# Patient Record
Sex: Female | Born: 1965 | Race: Black or African American | Hispanic: No | Marital: Married | Smoking: Never smoker
Health system: Southern US, Community
[De-identification: ages and names within clinical notes are randomized; demographics above are authoritative.]

## PROBLEM LIST (undated history)

## (undated) DIAGNOSIS — E119 Type 2 diabetes mellitus without complications: Secondary | ICD-10-CM

## (undated) DIAGNOSIS — Z87442 Personal history of urinary calculi: Secondary | ICD-10-CM

## (undated) DIAGNOSIS — C801 Malignant (primary) neoplasm, unspecified: Secondary | ICD-10-CM

## (undated) DIAGNOSIS — N950 Postmenopausal bleeding: Secondary | ICD-10-CM

## (undated) DIAGNOSIS — I10 Essential (primary) hypertension: Secondary | ICD-10-CM

## (undated) DIAGNOSIS — Z973 Presence of spectacles and contact lenses: Secondary | ICD-10-CM

## (undated) HISTORY — PX: ENDOMETRIAL ABLATION: SHX621

## (undated) HISTORY — PX: OTHER SURGICAL HISTORY: SHX169

## (undated) HISTORY — PX: LUNG SURGERY: SHX703

---

## 1992-05-21 HISTORY — PX: LIVER SURGERY: SHX698

## 1998-02-25 ENCOUNTER — Other Ambulatory Visit: Admission: RE | Admit: 1998-02-25 | Discharge: 1998-02-25 | Payer: Self-pay | Admitting: Obstetrics and Gynecology

## 1999-08-04 ENCOUNTER — Other Ambulatory Visit: Admission: RE | Admit: 1999-08-04 | Discharge: 1999-08-04 | Payer: Self-pay | Admitting: Obstetrics & Gynecology

## 2001-04-18 ENCOUNTER — Encounter: Payer: Self-pay | Admitting: Obstetrics and Gynecology

## 2001-04-18 ENCOUNTER — Ambulatory Visit (HOSPITAL_COMMUNITY): Admission: RE | Admit: 2001-04-18 | Discharge: 2001-04-18 | Payer: Self-pay | Admitting: Obstetrics and Gynecology

## 2001-05-09 ENCOUNTER — Other Ambulatory Visit: Admission: RE | Admit: 2001-05-09 | Discharge: 2001-05-09 | Payer: Self-pay | Admitting: Obstetrics and Gynecology

## 2002-08-14 ENCOUNTER — Emergency Department (HOSPITAL_COMMUNITY): Admission: EM | Admit: 2002-08-14 | Discharge: 2002-08-14 | Payer: Self-pay | Admitting: Emergency Medicine

## 2002-08-14 ENCOUNTER — Emergency Department (HOSPITAL_COMMUNITY): Admission: EM | Admit: 2002-08-14 | Discharge: 2002-08-15 | Payer: Self-pay | Admitting: Emergency Medicine

## 2002-08-14 ENCOUNTER — Encounter: Payer: Self-pay | Admitting: Emergency Medicine

## 2002-08-18 ENCOUNTER — Inpatient Hospital Stay (HOSPITAL_COMMUNITY): Admission: AD | Admit: 2002-08-18 | Discharge: 2002-08-20 | Payer: Self-pay | Admitting: Family Medicine

## 2004-12-14 ENCOUNTER — Other Ambulatory Visit: Admission: RE | Admit: 2004-12-14 | Discharge: 2004-12-14 | Payer: Self-pay | Admitting: Family Medicine

## 2007-05-22 HISTORY — PX: OTHER SURGICAL HISTORY: SHX169

## 2007-07-11 ENCOUNTER — Emergency Department (HOSPITAL_COMMUNITY): Admission: EM | Admit: 2007-07-11 | Discharge: 2007-07-11 | Payer: Self-pay | Admitting: Emergency Medicine

## 2007-07-14 ENCOUNTER — Ambulatory Visit (HOSPITAL_COMMUNITY): Admission: RE | Admit: 2007-07-14 | Discharge: 2007-07-14 | Payer: Self-pay | Admitting: Urology

## 2007-07-17 ENCOUNTER — Encounter: Admission: RE | Admit: 2007-07-17 | Discharge: 2007-07-17 | Payer: Self-pay | Admitting: Urology

## 2007-08-19 ENCOUNTER — Inpatient Hospital Stay (HOSPITAL_COMMUNITY): Admission: RE | Admit: 2007-08-19 | Discharge: 2007-08-22 | Payer: Self-pay | Admitting: Urology

## 2007-08-19 ENCOUNTER — Encounter (INDEPENDENT_AMBULATORY_CARE_PROVIDER_SITE_OTHER): Payer: Self-pay | Admitting: Urology

## 2008-01-30 ENCOUNTER — Encounter: Admission: RE | Admit: 2008-01-30 | Discharge: 2008-01-30 | Payer: Self-pay | Admitting: Urology

## 2008-03-26 ENCOUNTER — Ambulatory Visit (HOSPITAL_COMMUNITY): Admission: RE | Admit: 2008-03-26 | Discharge: 2008-03-26 | Payer: Self-pay | Admitting: Obstetrics and Gynecology

## 2008-03-26 ENCOUNTER — Encounter (INDEPENDENT_AMBULATORY_CARE_PROVIDER_SITE_OTHER): Payer: Self-pay | Admitting: Obstetrics and Gynecology

## 2008-08-12 ENCOUNTER — Ambulatory Visit: Payer: Self-pay | Admitting: Oncology

## 2008-08-13 ENCOUNTER — Encounter: Admission: RE | Admit: 2008-08-13 | Discharge: 2008-08-13 | Payer: Self-pay | Admitting: Urology

## 2008-08-24 LAB — COMPREHENSIVE METABOLIC PANEL
AST: 11 U/L (ref 0–37)
Albumin: 3.7 g/dL (ref 3.5–5.2)
Alkaline Phosphatase: 43 U/L (ref 39–117)
BUN: 24 mg/dL — ABNORMAL HIGH (ref 6–23)
Potassium: 3.5 mEq/L (ref 3.5–5.3)
Sodium: 137 mEq/L (ref 135–145)
Total Bilirubin: 0.3 mg/dL (ref 0.3–1.2)
Total Protein: 7.1 g/dL (ref 6.0–8.3)

## 2008-08-24 LAB — CBC WITH DIFFERENTIAL/PLATELET
EOS%: 4 % (ref 0.0–7.0)
LYMPH%: 29.5 % (ref 14.0–49.7)
MCH: 28.3 pg (ref 25.1–34.0)
MCV: 85.5 fL (ref 79.5–101.0)
MONO%: 3.6 % (ref 0.0–14.0)
RBC: 3.71 10*6/uL (ref 3.70–5.45)
RDW: 16.3 % — ABNORMAL HIGH (ref 11.2–14.5)

## 2009-01-26 ENCOUNTER — Ambulatory Visit (HOSPITAL_COMMUNITY): Admission: RE | Admit: 2009-01-26 | Discharge: 2009-01-26 | Payer: Self-pay | Admitting: Oncology

## 2009-02-01 ENCOUNTER — Ambulatory Visit: Payer: Self-pay | Admitting: Oncology

## 2009-08-05 ENCOUNTER — Ambulatory Visit: Payer: Self-pay | Admitting: Oncology

## 2009-08-09 LAB — COMPREHENSIVE METABOLIC PANEL WITH GFR
ALT: 12 U/L (ref 0–35)
AST: 14 U/L (ref 0–37)
Albumin: 3.9 g/dL (ref 3.5–5.2)
Alkaline Phosphatase: 47 U/L (ref 39–117)
BUN: 20 mg/dL (ref 6–23)
CO2: 22 meq/L (ref 19–32)
Calcium: 9.3 mg/dL (ref 8.4–10.5)
Chloride: 104 meq/L (ref 96–112)
Creatinine, Ser: 1.12 mg/dL (ref 0.40–1.20)
Glucose, Bld: 88 mg/dL (ref 70–99)
Potassium: 4.3 meq/L (ref 3.5–5.3)
Sodium: 137 meq/L (ref 135–145)
Total Bilirubin: 0.2 mg/dL — ABNORMAL LOW (ref 0.3–1.2)
Total Protein: 7.3 g/dL (ref 6.0–8.3)

## 2009-08-09 LAB — CBC WITH DIFFERENTIAL/PLATELET
BASO%: 0.6 % (ref 0.0–2.0)
Basophils Absolute: 0.1 10e3/uL (ref 0.0–0.1)
EOS%: 4.1 % (ref 0.0–7.0)
Eosinophils Absolute: 0.3 10e3/uL (ref 0.0–0.5)
HCT: 35.5 % (ref 34.8–46.6)
HGB: 12.1 g/dL (ref 11.6–15.9)
LYMPH%: 31.4 % (ref 14.0–49.7)
MCH: 30.3 pg (ref 25.1–34.0)
MCHC: 33.9 g/dL (ref 31.5–36.0)
MCV: 89.4 fL (ref 79.5–101.0)
MONO#: 0.6 10e3/uL (ref 0.1–0.9)
MONO%: 6.6 % (ref 0.0–14.0)
NEUT#: 4.8 10e3/uL (ref 1.5–6.5)
NEUT%: 57.3 % (ref 38.4–76.8)
Platelets: 273 10e3/uL (ref 145–400)
RBC: 3.97 10e6/uL (ref 3.70–5.45)
RDW: 14.1 % (ref 11.2–14.5)
WBC: 8.4 10e3/uL (ref 3.9–10.3)
lymph#: 2.6 10e3/uL (ref 0.9–3.3)

## 2010-02-06 ENCOUNTER — Ambulatory Visit: Payer: Self-pay | Admitting: Oncology

## 2010-02-06 ENCOUNTER — Ambulatory Visit (HOSPITAL_COMMUNITY): Admission: RE | Admit: 2010-02-06 | Discharge: 2010-02-06 | Payer: Self-pay | Admitting: Oncology

## 2010-02-06 LAB — CBC WITH DIFFERENTIAL/PLATELET
Basophils Absolute: 0 10*3/uL (ref 0.0–0.1)
Eosinophils Absolute: 0.4 10*3/uL (ref 0.0–0.5)
HCT: 37.1 % (ref 34.8–46.6)
HGB: 12 g/dL (ref 11.6–15.9)
MCV: 88.1 fL (ref 79.5–101.0)
MONO%: 6.9 % (ref 0.0–14.0)
NEUT#: 4 10*3/uL (ref 1.5–6.5)
NEUT%: 55.5 % (ref 38.4–76.8)
RDW: 14.5 % (ref 11.2–14.5)
lymph#: 2.3 10*3/uL (ref 0.9–3.3)

## 2010-02-06 LAB — COMPREHENSIVE METABOLIC PANEL
Albumin: 4 g/dL (ref 3.5–5.2)
BUN: 13 mg/dL (ref 6–23)
CO2: 26 mEq/L (ref 19–32)
Glucose, Bld: 95 mg/dL (ref 70–99)
Potassium: 4.2 mEq/L (ref 3.5–5.3)
Sodium: 136 mEq/L (ref 135–145)
Total Protein: 7.7 g/dL (ref 6.0–8.3)

## 2010-02-06 LAB — LACTATE DEHYDROGENASE: LDH: 150 U/L (ref 94–250)

## 2010-08-03 LAB — POCT I-STAT, CHEM 8
Creatinine, Ser: 1 mg/dL (ref 0.4–1.2)
Hemoglobin: 13.6 g/dL (ref 12.0–15.0)
Sodium: 137 mEq/L (ref 135–145)
TCO2: 27 mmol/L (ref 0–100)

## 2010-08-18 ENCOUNTER — Other Ambulatory Visit: Payer: Self-pay | Admitting: Oncology

## 2010-08-18 ENCOUNTER — Encounter (HOSPITAL_BASED_OUTPATIENT_CLINIC_OR_DEPARTMENT_OTHER): Payer: BC Managed Care – PPO | Admitting: Oncology

## 2010-08-18 DIAGNOSIS — D649 Anemia, unspecified: Secondary | ICD-10-CM

## 2010-08-18 DIAGNOSIS — C649 Malignant neoplasm of unspecified kidney, except renal pelvis: Secondary | ICD-10-CM

## 2010-08-18 DIAGNOSIS — D509 Iron deficiency anemia, unspecified: Secondary | ICD-10-CM

## 2010-08-18 LAB — COMPREHENSIVE METABOLIC PANEL
ALT: 11 U/L (ref 0–35)
AST: 12 U/L (ref 0–37)
Albumin: 3.9 g/dL (ref 3.5–5.2)
Alkaline Phosphatase: 49 U/L (ref 39–117)
Potassium: 3.9 mEq/L (ref 3.5–5.3)
Sodium: 135 mEq/L (ref 135–145)
Total Protein: 7.7 g/dL (ref 6.0–8.3)

## 2010-08-18 LAB — CBC WITH DIFFERENTIAL/PLATELET
BASO%: 0.3 % (ref 0.0–2.0)
EOS%: 3.5 % (ref 0.0–7.0)
Eosinophils Absolute: 0.3 10*3/uL (ref 0.0–0.5)
MCV: 86.4 fL (ref 79.5–101.0)
MONO%: 6.3 % (ref 0.0–14.0)
NEUT#: 5.9 10*3/uL (ref 1.5–6.5)
RBC: 4.04 10*6/uL (ref 3.70–5.45)
RDW: 14.8 % — ABNORMAL HIGH (ref 11.2–14.5)

## 2010-10-03 NOTE — Op Note (Signed)
NAME:  Jessica Cox, Jessica Cox NO.:  [id number]   MEDICAL RECORD NO.:  [id number]          PATIENT TYPE:  INP   LOCATION:  1405                         FACILITY:  [facility]   PHYSICIAN:  Valetta Fuller, M.D.  DATE OF BIRTH:  07-05-1965   DATE OF PROCEDURE:  08/19/2007  DATE OF DISCHARGE:                               OPERATIVE REPORT   PREOPERATIVE DIAGNOSIS:  Large left renal mass.   POSTOPERATIVE DIAGNOSIS:  Large left renal mass.   PROCEDURE PERFORMED:  Left radical nephrectomy with regional  lymphadenectomy.   SURGEON:  Valetta Fuller, M.D.   ASSISTANT:  Lindaann Slough, M.D.   ANESTHESIA:  General endotracheal.   INDICATIONS:  Jessica Cox is a 45 year old female.  She presented to the  local emergency room with left flank pain and was diagnosed with a 7 mm  stone in her left proximal ureter with some hydronephrosis.  She also  had an unexpected 8-10 cm mass emanating from the upper pole of her left  kidney.  This was felt to be concerning for renal cell carcinoma.  An  MRI was subsequently done which confirmed an enhancing mass of about 10  cm coming off the upper pole of the left kidney radiographically  consistent with a renal cell carcinoma.  No obvious metastatic disease.  No evidence of adenopathy with no evidence of vascular invasion.  The  patient's flank pain resolved and, therefore, we did not feel the stone  had to be dealt with unless it became more symptomatic to her.  The  patient subsequently had a chest x-ray and has had no evidence of  metastatic disease.  This is a large partially hemorrhagic mass with  substantial enhancement and, again, very concerning for renal cell  carcinoma.  We suggested that she undergo a left radical nephrectomy.  The patient accepted that recommendation and appeared to understand the  advantages, disadvantages and potential complications.  I did review her  case with one of our laparoscopic surgeons, he felt this tumor  would be  better dealt with with an open approach which we concurred with.  She  now presents for the procedure.   TECHNIQUE AND FINDINGS:  The patient was identified and the appropriate  site marked.  She was brought to the operating room, placed in a supine  position, and had successful induction of general endotracheal  anesthesia.  The patient was left supine.  A Foley catheter was placed.  The table was slightly flexed.  She was then prepped and draped in a  normal manner.  The patient previously had a right subcostal incision  and we did a left subcostal modified Chevron incision exposing the left  upper quadrant.  Palpably, a very large tumor was noted in the upper  pole of the left kidney.  No gross evidence of metastatic disease.  The  left colon was reflected off of Gerota's fascia.  Inferiorly, the ureter  and gonadal vein was identified.  The gonadal vein was clipped and then  ligated.  The ureter was also clipped and then transected.  The upper  pole  was lifted.  The hilum was identified.  There appeared to be a  single renal vein and artery.  The artery was doubly sutured with silk  suture proximally and then transected.  The renal vein collapse nicely  and was also doubly ligated with #1 silk suture.  Attention was then  turned to the upper pole mass which was carefully freed off some  attachments to the spleen.  The pancreas was identified and no injury  occurred.  The adrenal gland was taken with the specimen utilizing clips  superior to the adrenal gland.  Blood loss was relatively minimal at  less than 100 mL.  There was no active bleeding or other abnormalities.  The whole flank was then copiously irrigated.  There was no pathologic  adenopathy but a few shoddy lymph nodes.  A regional lymphadenectomy  around the area of the hilum was undertaken and that tissue sent  separately for permanent sectioning.  The patient's incision was closed  in an anatomic manner.  A  double layer closure with #1 PDS suture was  performed.  The skin was infiltrated with Marcaine and the skin closed  with clips.  The patient remained hemodynamically stable during the  procedure and had no obvious complications or problems.  She was brought  to the recovery room in stable condition.           ______________________________  Valetta Fuller, M.D.  Electronically Signed     DSG/MEDQ  D:  08/19/2007  T:  08/19/2007  Job:  [id number]

## 2010-10-03 NOTE — Op Note (Signed)
NAME:  Jessica Cox, Jessica Cox NO.:  [id number]   MEDICAL RECORD NO.:  [id number]          PATIENT TYPE:  AMB   LOCATION:  SDC                           FACILITY:  [facility]   PHYSICIAN:  Crist Fat. Rivard, M.D. DATE OF BIRTH:  1965-07-17   DATE OF PROCEDURE:  DATE OF DISCHARGE:                               OPERATIVE REPORT   PREOPERATIVE DIAGNOSES:  Submucosal fibroid with menorrhagia and anemia.   POSTOPERATIVE DIAGNOSES:  Submucosal fibroid with menorrhagia and  anemia.   ANESTHESIA:  General, Dr. Jean Rosenthal.   PROCEDURE:  Hysteroscopy with resection of submucosal fibroid and  endometrial ablation with NovaSure.   SURGEON:  Jalea Bronaugh. Rivard, MD   ASSISTANT:  None.   ESTIMATED BLOOD LOSS:  Minimal.   PROCEDURE IN DETAIL:  After being informed of the planned procedure with  possible complications including bleeding, infection, and uterine  perforation with intra-abdominal organ damage as well as failure of  treatment, informed consent was obtained.  The patient was taken to OR  #7, given general anesthesia with laryngeal mask without complication.  She was placed in the lithotomy position, prepped and draped in a  sterile fashion, and her bladder was emptied with an in-and-out red  rubber catheter.  Pelvic exam reveals an anteverted uterus, normal in  size and shape and mobile, and 2 normal adnexa.  A weighted speculum was  inserted in the vagina.  Anterior lip of the cervix was grasped with a  tenaculum forceps and we proceeded with a paracervical block using  Novocain 1% 20 mL in the usual fashion.  Uterus was then sounded at 10  cm and the cervical length was measured at 3.5 cm.  The cervix was then  easily dilated using Hegar dilator until #33, which allowed easy entry  of an operative hysteroscope with a single-loop resectoscope.  Using  sorbitol at a maximum pressure of 90 mmHg, we were able to visualize the  entire uterine cavity with both tubal ostia, which  appeared normal.  We  easily identified an anterior left cornual submucosal fibroid measuring  3 cm.  We proceeded with its resection with multiple passes until we had  flattened the anterior wall of the uterus.  Hysteroscope was removed.  A  sharp curette was used to remove endometrial curettings.  Hysteroscope  was reinserted.  The cavity was then flushed with LR, and we noted a  satisfactory cavity with good resection of the fibroid and superficial  endometrium having been removed.  Hysteroscope was then removed and the  NovaSure was inserted easily.  The cervix was regrasped with a Jacobs  forceps and the cavity integrity was confirmed.  We then proceeded with  endometrial ablation after firing NovaSure at a power of 104 and a time  of 50 seconds.  The NovaSure was then removed and the hysteroscope was  reinserted to confirm it completely blanched uterine cavity and both  cornua having been blanched appropriately as well.   Instruments were then removed and the Jacobs forceps was removed with no  bleeding.   Instrument and sponge count was complete x2.  Estimated blood loss was  minimal.  Water deficit was 50 mL.  The procedure was very well  tolerated by the patient, who was taken to recovery room in a well and  stable condition and discharged home today.   SPECIMEN:  Submucosal fibroid fragments and endometrial curettings sent  to Pathology.      Crist Fat Rivard, M.D.  Electronically Signed     SAR/MEDQ  D:  03/26/2008  T:  03/27/2008  Job:  [id number]

## 2010-10-03 NOTE — H&P (Signed)
NAME:  Jessica Cox, Jessica Cox NO.:  [id number]   MEDICAL RECORD NO.:  [id number]          PATIENT TYPE:  INP   LOCATION:  1405                         FACILITY:  [facility]   PHYSICIAN:  Valetta Fuller, M.D.  DATE OF BIRTH:  May 31, 1965   DATE OF ADMISSION:  08/19/2007  DATE OF DISCHARGE:                              HISTORY & PHYSICAL   CHIEF COMPLAINT:  Large left renal mass.   HISTORY OF PRESENT ILLNESS:  Jessica Cox is a 45 year old female.  She  recently developed left-sided flank discomfort and presented to the  emergency room where she was diagnosed with a proximal left ureteral  calculus.  She also had a 8-10 cm mass emanating from the upper pole of  her left kidney.  A subsequent MRI demonstrated a partially hemorrhagic  but enhancing mass in the upper pole left kidney quite concerning for  renal cell carcinoma.  There was no evidence of metastatic disease  within the abdomen and chest x-ray was unremarkable.  Her left flank  pain resolved and we did decide that the left ureteral stone did not  require any definitive management.  It was recommended to the patient  that she have a left radical nephrectomy.  She appeared to understand  the advantages and disadvantages of this.  We felt an open approach  would make the most sense.  She is now presenting for admission status  post her surgery which is to be done soon.  She has had no gross  hematuria, really no abdominal or flank discomfort at this point.   PAST MEDICAL HISTORY:  Notable for  1. Diabetes mellitus type 2.  2. Hypertension.  3. Hyperlipidemia.   CURRENT MEDICATIONS:  Include Avandia, iron supplementation and some  over-the-counter and p.r.n. medications.   ALLERGIES:  The patient has no drug allergies.   She is a nonsmoker.   FAMILY HISTORY:  Notable for diabetes mellitus.   REVIEW OF SYSTEMS:  Negative for abdominal or flank discomfort.  No  gross hematuria.   PHYSICAL EXAMINATION:  VITAL  SIGNS:  Blood pressure 134/86.  She is  afebrile.  LUNGS:  Respiratory effort normal with clear lungs to auscultation.  HEART:  Regular rate and rhythm.  ABDOMEN:  Soft and nontender without palpable mass.  No CVA tenderness.  GU:  Normal external genitalia.  EXTREMITIES:  No lower extremity edema.   LABORATORY DATA:  Overall systemic renal function normal.  The patient  does have a chronic anemia with hemoglobin 9.8.   ASSESSMENT:  Large left renal mass 8 x 10 cm.  This is consistent  radiographically with renal cell carcinoma.  The patient is to undergo  radical nephrectomy on the left side today and will hopefully be  admitted for routine postoperative care.           ______________________________  Valetta Fuller, M.D.  Electronically Signed     DSG/MEDQ  D:  08/19/2007  T:  08/20/2007  Job:  [id number]

## 2010-10-06 NOTE — Discharge Summary (Signed)
NAME:  Jessica Cox, Jessica Cox NO.:  [id number]   MEDICAL RECORD NO.:  [id number]          PATIENT TYPE:  INP   LOCATION:  1405                         [facility]ACILITY:  [facility]   PHYSICIAN:  Valetta Fuller, M.D.  DATE OF BIRTH:  1965/08/30   DATE OF ADMISSION:  08/19/2007  DATE OF DISCHARGE:  08/22/2007                               DISCHARGE SUMMARY   DISCHARGE DIAGNOSES:  1. Malignant neoplasm of the kidney.  2. Urinary calculus.  3. Type 2 diabetes mellitus.  4. Hypertension.   PROCEDURE PERFORMED:  Left radical nephrectomy with dissection of hilar  lymph nodes on August 19, 2007.   HOSPITAL COURSE:  Ms. Grays is a 45 year old female.  She had  originally presented to the emergency room with left flank pain and was  diagnosed with a 7 mm stone in her left proximal ureter with  hydronephrosis.  She also had an 8-10 cm mass emanating from the upper  pole of her left kidney which was felt to be radiographically very  concerning for renal cell carcinoma.  The abdominal MRI subsequently  confirmed an enhancing mass on the upper pole left kidney consistent  with renal cell carcinoma.  There was no evidence of any metastatic  disease.  The 7 mm stone was involving the left proximal ureter on the  same side.  Her clinical situation, with regard flank pain, improved  and, therefore, we did not feel that the left ureteral stone required  definitive management since she was going to be undergoing left radical  nephrectomy.  Additional imaging studies showed no evidence of  metastatic disease.  The patient underwent extensive consultation with  regard to surgical issues and elected to proceed with left radical  nephrectomy.   On August 19, 2007, the patient underwent exploration via left chevron  incision.  A large tumor was noted in the upper pole of the left kidney.  There was no evidence of gross metastatic disease.  The surgery was  without incident with minimal blood loss.  The  patient's postoperative  course was also unremarkable.  Her postoperative hemoglobin was 9.5,  compared to 9.8 preoperatively.  Postop creatinine was 1.1.  The patient  had a normal postop ileus which resolved by postoperative day #3.  At  that point she was afebrile.  She had had a bowel movement and was  ambulating.  Her exam was unremarkable.  The patient had no other  obvious problems or difficulties.  Her final pathology needed to be sent  out for confirmation.  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  Four out of four lymph nodes were negative for any  metastatic disease.   DISPOSITION:  The patient was discharged to home.  She was given a  prescription for pain medication.  Routine postoperative instructions  were provided and the patient will follow up in approximately 7-10 days  in our office.           ______________________________  Valetta Fuller, M.D.  Electronically  Signed    DSG/MEDQ  D:  10/20/2007  T:  10/20/2007  Job:  [id number]

## 2010-10-10 NOTE — Discharge Summary (Signed)
NAME:  Jessica, Cox                        ACCOUNT NO.:  [id number]   MEDICAL RECORD NO.:  [id number]                   PATIENT TYPE:   LOCATION:                                       FACILITY:   PHYSICIAN:  Deirdre Peer. Polite, M.D.              DATE OF BIRTH:   DATE OF ADMISSION:  DATE OF DISCHARGE:                                 DISCHARGE SUMMARY   DISCHARGE DIAGNOSES:  1. Left periorbital cellulitis.  2. Chronic sinusitis.  3. Microcytic anemia.   DISCHARGE MEDICATIONS:  1. Unasyn 3 g IV q.6h. x2 days.  2. Ferrous sulfate 325 mg one q.8h.   DISPOSITION:  The patient is being discharged in stable condition.   FOLLOWUP:  1. Outpatient followup with ENT in one day with Dr. Jearld Fenton.  2. Outpatient followup with primary M.D. in one week, Dr. Rodman Key on     07/27/2002 at 9:30.   CONSULTATIONS:  Dr. Jearld Fenton of ENT.   STUDIES:  CT scan of the head __________ appearance of the brain,  inflammatory paranasal sinus disease worse on the left.  Blood cultures  negative.  BMET within normal limits.  CBC with a hemoglobin of 8.7, MCV  71.4.  Outpatient CT scan at [facility] showed periorbital soft  tissue swelling due to cellulitis, most likely secondary to extensive  paranasal sinus disease   HISTORY OF PRESENT ILLNESS:  The patient is a 45 year old black female who  was admitted to [facility] after failing treatment for chronic  sinusitis.  In addition to failing outpatient treatment for sinusitis the  patient developed significant periorbital cellulitis on the left eye.  Admission was deemed necessary for further evaluation and treatment.   PAST MEDICAL HISTORY:  1. As stated above.  2. In addition, includes seasonal allergies.  3. Microcytic anemia.  4. Hepatic hemangiomas.   OUTPATIENT MEDICATIONS:  1. Beconase.  2. Multivitamin.   FAMILY HISTORY:  Noncontributory.   REVIEW OF SYMPTOMS:  Negative for fever, chills, or headaches.  Significant  for left eye swelling, pain, no discharge.   PHYSICAL EXAMINATION:  VITAL SIGNS:  Hemodynamically stable, afebrile.  HEENT:  Significant for left eye periorbital swelling, erythema, no  discharge.  Extraocular movements were intact.  Sclerae not injected.  The  rest of the physical examination is within normal limits.   HOSPITAL COURSE:  The patient was admitted to a medicine floor bed for  evaluation and treatment of left periorbital cellulitis secondary to chronic  sinusitis.  The patient was treated with IV antibiotics, and was seen in  consultation by ENT, Dr. Jearld Fenton.  The patient's medical management was  further optimized by adding Afrin and saline spray.  Plan was for possible  surgery if no improvement with above treatment.  However, the patient's  hospital course was one of continued improvement, no surgical procedure was  indicated.  On 08/20/2002, the patient was deemed medically stable for  discharge by ENT.  Plans are for outpatient antibiotics x2 days, then  continue p.o. antibiotics which will be instituted by Dr. Jearld Fenton.  At this  time, the patient is medically stable for discharge.                                                  Deirdre Peer. Polite, M.D.    RDP/MEDQ  D:  08/20/2002  T:  08/21/2002  Job:  [id number]

## 2011-02-12 LAB — URINALYSIS, ROUTINE W REFLEX MICROSCOPIC
Bilirubin Urine: NEGATIVE
Glucose, UA: NEGATIVE
Glucose, UA: NEGATIVE
Ketones, ur: NEGATIVE
Leukocytes, UA: NEGATIVE
Protein, ur: NEGATIVE
Protein, ur: NEGATIVE
pH: 6
pH: 7.5

## 2011-02-12 LAB — CROSSMATCH
ABO/RH(D): O POS
Antibody Screen: NEGATIVE

## 2011-02-12 LAB — URINE MICROSCOPIC-ADD ON

## 2011-02-12 LAB — CBC
HCT: 29 — ABNORMAL LOW
MCHC: 32.6
MCV: 76.9 — ABNORMAL LOW
Platelets: 417 — ABNORMAL HIGH
RDW: 16.7 — ABNORMAL HIGH
WBC: 11.9 — ABNORMAL HIGH

## 2011-02-12 LAB — HEMOGLOBIN AND HEMATOCRIT, BLOOD: Hemoglobin: 9.8 — ABNORMAL LOW

## 2011-02-12 LAB — COMPREHENSIVE METABOLIC PANEL
AST: 18
Albumin: 3.3 — ABNORMAL LOW
BUN: 11
Calcium: 8.7
Creatinine, Ser: 0.92
GFR calc Af Amer: >60
Total Protein: 7.5

## 2011-02-12 LAB — DIFFERENTIAL
Basophils Absolute: 0.1
Eosinophils Relative: 2
Lymphocytes Relative: 19
Lymphs Abs: 2.3
Monocytes Absolute: 0.5
Neutro Abs: 8.9 — ABNORMAL HIGH

## 2011-02-12 LAB — POCT CARDIAC MARKERS
CKMB, poc: 1
Myoglobin, poc: 52.5
Troponin i, poc: <0.05

## 2011-02-12 LAB — BASIC METABOLIC PANEL
BUN: 7
Calcium: 9.1
Creatinine, Ser: 0.76
GFR calc non Af Amer: >60
Glucose, Bld: 133 — ABNORMAL HIGH

## 2011-02-13 LAB — BASIC METABOLIC PANEL
CO2: 29
GFR calc Af Amer: >60
GFR calc non Af Amer: 56 — ABNORMAL LOW
Glucose, Bld: 112 — ABNORMAL HIGH
Potassium: 4
Sodium: 138

## 2011-02-13 LAB — CBC
HCT: 27.8 — ABNORMAL LOW
Hemoglobin: 9.5 — ABNORMAL LOW
RBC: 3.61 — ABNORMAL LOW
RDW: 19.2 — ABNORMAL HIGH

## 2011-02-13 LAB — DIFFERENTIAL
Basophils Absolute: 0
Eosinophils Relative: 1
Lymphocytes Relative: 16
Monocytes Absolute: 0.6
Monocytes Relative: 5
Neutro Abs: 9.2 — ABNORMAL HIGH

## 2011-02-14 ENCOUNTER — Other Ambulatory Visit: Payer: Self-pay | Admitting: Oncology

## 2011-02-14 ENCOUNTER — Ambulatory Visit (HOSPITAL_COMMUNITY)
Admission: RE | Admit: 2011-02-14 | Discharge: 2011-02-14 | Disposition: A | Discharge status: Home / Self Care | Payer: BC Managed Care – PPO | Source: Ambulatory Visit | Attending: Oncology | Admitting: Oncology

## 2011-02-14 ENCOUNTER — Other Ambulatory Visit: Payer: Self-pay | Admitting: Obstetrics and Gynecology

## 2011-02-14 ENCOUNTER — Encounter (HOSPITAL_BASED_OUTPATIENT_CLINIC_OR_DEPARTMENT_OTHER): Payer: BC Managed Care – PPO | Admitting: Oncology

## 2011-02-14 DIAGNOSIS — C649 Malignant neoplasm of unspecified kidney, except renal pelvis: Secondary | ICD-10-CM

## 2011-02-14 DIAGNOSIS — Z85528 Personal history of other malignant neoplasm of kidney: Secondary | ICD-10-CM | POA: Insufficient documentation

## 2011-02-14 DIAGNOSIS — Z1231 Encounter for screening mammogram for malignant neoplasm of breast: Secondary | ICD-10-CM

## 2011-02-14 DIAGNOSIS — R911 Solitary pulmonary nodule: Secondary | ICD-10-CM | POA: Insufficient documentation

## 2011-02-14 DIAGNOSIS — Z09 Encounter for follow-up examination after completed treatment for conditions other than malignant neoplasm: Secondary | ICD-10-CM | POA: Insufficient documentation

## 2011-02-14 DIAGNOSIS — K5730 Diverticulosis of large intestine without perforation or abscess without bleeding: Secondary | ICD-10-CM | POA: Insufficient documentation

## 2011-02-14 LAB — CMP (CANCER CENTER ONLY)
ALT(SGPT): 20 U/L (ref 10–47)
AST: 19 U/L (ref 11–38)
BUN, Bld: 13 mg/dL (ref 7–22)
Calcium: 9 mg/dL (ref 8.0–10.3)
Chloride: 99 mEq/L (ref 98–108)
Creat: 1 mg/dl (ref 0.6–1.2)
Total Bilirubin: 0.3 mg/dl (ref 0.20–1.60)

## 2011-02-14 LAB — CBC WITH DIFFERENTIAL/PLATELET
BASO%: 0.7 % (ref 0.0–2.0)
Basophils Absolute: 0 10*3/uL (ref 0.0–0.1)
EOS%: 5.3 % (ref 0.0–7.0)
HCT: 33.3 % — ABNORMAL LOW (ref 34.8–46.6)
HGB: 11.3 g/dL — ABNORMAL LOW (ref 11.6–15.9)
LYMPH%: 26.9 % (ref 14.0–49.7)
MCH: 29.2 pg (ref 25.1–34.0)
MCHC: 33.8 g/dL (ref 31.5–36.0)
MCV: 86.6 fL (ref 79.5–101.0)
MONO%: 7.6 % (ref 0.0–14.0)
NEUT%: 59.5 % (ref 38.4–76.8)
Platelets: 269 10*3/uL (ref 145–400)
lymph#: 2 10*3/uL (ref 0.9–3.3)

## 2011-02-20 LAB — CBC
HCT: 32.2 — ABNORMAL LOW
Hemoglobin: 10.6 — ABNORMAL LOW
MCV: 85.8
RDW: 15.3
WBC: 8.6

## 2011-02-20 LAB — BASIC METABOLIC PANEL
BUN: 17
Chloride: 101
GFR calc non Af Amer: 57 — ABNORMAL LOW
Glucose, Bld: 115 — ABNORMAL HIGH
Potassium: 3.5
Sodium: 135

## 2011-02-23 ENCOUNTER — Encounter (HOSPITAL_BASED_OUTPATIENT_CLINIC_OR_DEPARTMENT_OTHER): Payer: BC Managed Care – PPO | Admitting: Oncology

## 2011-02-23 ENCOUNTER — Ambulatory Visit
Admission: RE | Admit: 2011-02-23 | Discharge: 2011-02-23 | Disposition: A | Discharge status: Home / Self Care | Payer: BC Managed Care – PPO | Source: Ambulatory Visit | Attending: Obstetrics and Gynecology | Admitting: Obstetrics and Gynecology

## 2011-02-23 DIAGNOSIS — Z1231 Encounter for screening mammogram for malignant neoplasm of breast: Secondary | ICD-10-CM

## 2011-02-23 DIAGNOSIS — D509 Iron deficiency anemia, unspecified: Secondary | ICD-10-CM

## 2011-02-23 DIAGNOSIS — C649 Malignant neoplasm of unspecified kidney, except renal pelvis: Secondary | ICD-10-CM

## 2011-06-28 ENCOUNTER — Telehealth: Payer: Self-pay | Admitting: Oncology

## 2011-06-28 NOTE — Telephone Encounter (Signed)
Called pt, left message, 08/22/11, lab and MD

## 2011-07-06 ENCOUNTER — Telehealth: Payer: Self-pay | Admitting: Oncology

## 2011-07-06 NOTE — Telephone Encounter (Signed)
PT CALLED AND R/S APPT FOR 04/03 TO 04/05

## 2011-08-22 ENCOUNTER — Other Ambulatory Visit: Payer: BC Managed Care – PPO | Admitting: Lab

## 2011-08-22 ENCOUNTER — Ambulatory Visit: Payer: BC Managed Care – PPO | Admitting: Oncology

## 2011-08-24 ENCOUNTER — Other Ambulatory Visit (HOSPITAL_BASED_OUTPATIENT_CLINIC_OR_DEPARTMENT_OTHER): Payer: BC Managed Care – PPO | Admitting: Lab

## 2011-08-24 ENCOUNTER — Ambulatory Visit (HOSPITAL_BASED_OUTPATIENT_CLINIC_OR_DEPARTMENT_OTHER): Payer: BC Managed Care – PPO | Admitting: Oncology

## 2011-08-24 ENCOUNTER — Telehealth: Payer: Self-pay | Admitting: Oncology

## 2011-08-24 VITALS — BP 123/77 | HR 104 | Temp 97.9°F | Ht 65.0 in | Wt 241.4 lb

## 2011-08-24 DIAGNOSIS — D649 Anemia, unspecified: Secondary | ICD-10-CM

## 2011-08-24 DIAGNOSIS — C649 Malignant neoplasm of unspecified kidney, except renal pelvis: Secondary | ICD-10-CM

## 2011-08-24 DIAGNOSIS — D509 Iron deficiency anemia, unspecified: Secondary | ICD-10-CM

## 2011-08-24 LAB — COMPREHENSIVE METABOLIC PANEL
ALT: 11 U/L (ref 0–35)
AST: 11 U/L (ref 0–37)
Albumin: 3.7 g/dL (ref 3.5–5.2)
Alkaline Phosphatase: 54 U/L (ref 39–117)
Glucose, Bld: 144 mg/dL — ABNORMAL HIGH (ref 70–99)
Potassium: 3.9 mEq/L (ref 3.5–5.3)
Sodium: 136 mEq/L (ref 135–145)
Total Protein: 7.4 g/dL (ref 6.0–8.3)

## 2011-08-24 LAB — CBC WITH DIFFERENTIAL/PLATELET
Eosinophils Absolute: 0.3 10*3/uL (ref 0.0–0.5)
HCT: 33.3 % — ABNORMAL LOW (ref 34.8–46.6)
LYMPH%: 33.8 % (ref 14.0–49.7)
MCV: 86.8 fL (ref 79.5–101.0)
MONO#: 0.5 10*3/uL (ref 0.1–0.9)
MONO%: 5.8 % (ref 0.0–14.0)
NEUT#: 4.3 10*3/uL (ref 1.5–6.5)
NEUT%: 55.6 % (ref 38.4–76.8)
Platelets: 291 10*3/uL (ref 145–400)
WBC: 7.7 10*3/uL (ref 3.9–10.3)

## 2011-08-24 NOTE — Telephone Encounter (Signed)
Gv pt appt for oct2013 °

## 2011-08-24 NOTE — Progress Notes (Signed)
Hematology and Oncology Follow Up Visit  Jessica Cox [id number] 09-12-1965 46 y.o. 08/24/2011 3:08 PM  CC: Jessica Cox, M.D.  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   Current therapy: Observation and surveillance.  Interim History:  Jessica Cox presents today for a followup visit, a pleasant 46 year old female with MTSCC surgically removed in 2009.  She has continued to do very well without any evidence to suggest recurrent disease.  She did not report any abdominal pain or flank pain.  She did not report any hematuria or dysuria.  She had not had any major changes in performance status or activity level.  She has continued to perform activities without any major decline.  She had not had any more than usual menstrual bleeding.  She had reported stopping taking iron replacement.  Medications: I have reviewed the patient's current medications. No current outpatient prescriptions on file.  Allergies: Allergies not on file  Past Medical History, Surgical history, Social history, and Family History were reviewed and updated.  Review of Systems: Constitutional:  Negative for fever, chills, night sweats, anorexia, weight loss, pain. Cardiovascular: no chest pain or dyspnea on exertion Respiratory: negative Neurological: negative Dermatological: negative ENT: negative Skin: Negative. Gastrointestinal: no abdominal pain, change in bowel habits, or black or bloody stools Genito-Urinary: negative Hematological and Lymphatic: negative Breast: negative Musculoskeletal: negative Remaining ROS negative. Physical Exam: Blood pressure 123/77, pulse 104, temperature 97.9 F (36.6 C), temperature source Oral, height 5\' 5"  (1.651 m), weight 241 lb 6.4 oz (109.498 kg). ECOG:  General appearance: alert Head: Normocephalic,  without obvious abnormality, atraumatic Neck: no adenopathy, no carotid bruit, no JVD, supple, symmetrical, trachea midline and thyroid not enlarged, symmetric, no tenderness/mass/nodules Lymph nodes: Cervical, supraclavicular, and axillary nodes normal. Heart:regular rate and rhythm, S1, S2 normal, no murmur, click, rub or gallop Lung:chest clear, no wheezing, rales, normal symmetric air entry Abdomin: soft, non-tender, without masses or organomegaly EXT:no erythema, induration, or nodules   Lab Results: Lab Results  Component Value Date   WBC 7.7 08/24/2011   HGB 10.8* 08/24/2011   HCT 33.3* 08/24/2011   MCV 86.8 08/24/2011   PLT 291 08/24/2011     Chemistry      Component Value Date/Time   NA 141 02/14/2011 0819   NA 135 08/18/2010 0844   NA 135 08/18/2010 0844   K 4.2 02/14/2011 0819   K 3.9 08/18/2010 0844   K 3.9 08/18/2010 0844   CL 99 02/14/2011 0819   CL 100 08/18/2010 0844   CL 100 08/18/2010 0844   CO2 26 02/14/2011 0819   CO2 23 08/18/2010 0844   CO2 23 08/18/2010 0844   BUN 13 02/14/2011 0819   BUN 17 08/18/2010 0844   BUN 17 08/18/2010 0844   CREATININE 1.0 02/14/2011 0819   CREATININE 1.19 08/18/2010 0844   CREATININE 1.19 08/18/2010 0844      Component Value Date/Time   CALCIUM 9.0 02/14/2011 0819   CALCIUM 9.0 08/18/2010 0844   CALCIUM 9.0 08/18/2010 0844   ALKPHOS 52 02/14/2011 0819   ALKPHOS 49 08/18/2010 0844   ALKPHOS 49 08/18/2010 0844   AST 19 02/14/2011 0819   AST 12 08/18/2010 0844   AST 12 08/18/2010 0844   ALT 11 08/18/2010 0844   ALT 11 08/18/2010 0844   BILITOT 0.30 02/14/2011 0819   BILITOT  0.3 08/18/2010 0844   BILITOT 0.3 08/18/2010 0844      Impression and Plan:  This is a 46 year old female with the following issues: 1. 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  Continue followup every 6 months and imaging studies as needed 2. Iron deficiency anemia.  I recommended her to restart  her iron replacement at this time.  Followup will be in 6 months' time. I have talked to her about IV iron if she could not tolerate po iron.   [facility], MD 4/5/20133:08 PM

## 2012-02-22 ENCOUNTER — Other Ambulatory Visit (HOSPITAL_BASED_OUTPATIENT_CLINIC_OR_DEPARTMENT_OTHER): Payer: BC Managed Care – PPO | Admitting: Lab

## 2012-02-22 ENCOUNTER — Ambulatory Visit (HOSPITAL_BASED_OUTPATIENT_CLINIC_OR_DEPARTMENT_OTHER): Payer: BC Managed Care – PPO | Admitting: Oncology

## 2012-02-22 ENCOUNTER — Telehealth: Payer: Self-pay | Admitting: Oncology

## 2012-02-22 VITALS — BP 118/77 | HR 114 | Temp 99.1°F | Resp 22 | Ht 65.0 in | Wt 251.7 lb

## 2012-02-22 DIAGNOSIS — C649 Malignant neoplasm of unspecified kidney, except renal pelvis: Secondary | ICD-10-CM

## 2012-02-22 DIAGNOSIS — D649 Anemia, unspecified: Secondary | ICD-10-CM

## 2012-02-22 DIAGNOSIS — D509 Iron deficiency anemia, unspecified: Secondary | ICD-10-CM

## 2012-02-22 LAB — CBC WITH DIFFERENTIAL/PLATELET
EOS%: 3.8 % (ref 0.0–7.0)
LYMPH%: 24.1 % (ref 14.0–49.7)
MCH: 27.5 pg (ref 25.1–34.0)
MCHC: 31.6 g/dL (ref 31.5–36.0)
MCV: 87 fL (ref 79.5–101.0)
MONO%: 7.2 % (ref 0.0–14.0)
NEUT#: 7.5 10*3/uL — ABNORMAL HIGH (ref 1.5–6.5)
Platelets: 288 10*3/uL (ref 145–400)
RBC: 3.96 10*6/uL (ref 3.70–5.45)
RDW: 15.4 % — ABNORMAL HIGH (ref 11.2–14.5)

## 2012-02-22 LAB — COMPREHENSIVE METABOLIC PANEL (CC13)
ALT: 14 U/L (ref 0–55)
CO2: 20 mEq/L — ABNORMAL LOW (ref 22–29)
Calcium: 9.4 mg/dL (ref 8.4–10.4)
Chloride: 103 mEq/L (ref 98–107)
Creatinine: 1.2 mg/dL — ABNORMAL HIGH (ref 0.6–1.1)
Glucose: 171 mg/dl — ABNORMAL HIGH (ref 70–99)
Total Bilirubin: 0.2 mg/dL (ref 0.20–1.20)
Total Protein: 8 g/dL (ref 6.4–8.3)

## 2012-02-22 LAB — IRON AND TIBC: Iron: 33 ug/dL — ABNORMAL LOW (ref 42–145)

## 2012-02-22 LAB — FERRITIN: Ferritin: 85 ng/mL (ref 10–291)

## 2012-02-22 NOTE — Progress Notes (Signed)
Hematology and Oncology Follow Up Visit  Jessica Cox [id number] 25-Mar-1966 46 y.o. 02/22/2012 3:41 PM  CC: Jessica Cox, M.D.  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  Current therapy: Observation and surveillance.  Interim History: Jessica Cox presents today for a followup visit, a pleasant 46 year old female with MTSCC surgically removed in 2009. She has continued to do very well without any evidence to suggest recurrent disease.  She did not report any abdominal pain or flank pain.  She did not report any hematuria or dysuria.  She had not had any major changes in performance status or activity level.  She has continued to perform activities without any major decline.  She had not had any more than usual menstrual bleeding.  She had reported stopping taking iron replacement as it causing her to have diarrhea.   Medications: I have reviewed the patient's current medications. Current outpatient prescriptions:fluticasone (FLONASE) 50 MCG/ACT nasal spray, Place 2 sprays into the nose daily., Disp: , Rfl: ;  pioglitazone (ACTOS) 15 MG tablet, Take 15 mg by mouth daily., Disp: , Rfl: ;  simvastatin (ZOCOR) 10 MG tablet, Take 10 mg by mouth at bedtime., Disp: , Rfl: ;  triamterene-hydrochlorothiazide (MAXZIDE-25) 37.5-25 MG per tablet, Take 1 tablet by mouth daily., Disp: , Rfl:   Allergies: No Known Allergies  Past Medical History, Surgical history, Social history, and Family History were reviewed and updated.  Review of Systems: Constitutional:  Negative for fever, chills, night sweats, anorexia, weight loss, pain. Cardiovascular: no chest pain or dyspnea on exertion Respiratory: negative Neurological: negative Dermatological: negative ENT: negative Skin: Negative. Gastrointestinal: no abdominal pain, change in bowel  habits, or black or bloody stools Genito-Urinary: negative Hematological and Lymphatic: negative Breast: negative Musculoskeletal: negative Remaining ROS negative. Physical Exam: Blood pressure 118/77, pulse 114, temperature 99.1 F (37.3 C), temperature source Oral, resp. rate 22, height 5\' 5"  (1.651 m), weight 251 lb 11.2 oz (114.17 kg). ECOG:  General appearance: alert Head: Normocephalic, without obvious abnormality, atraumatic Neck: no adenopathy, no carotid bruit, no JVD, supple, symmetrical, trachea midline and thyroid not enlarged, symmetric, no tenderness/mass/nodules Lymph nodes: Cervical, supraclavicular, and axillary nodes normal. Heart:regular rate and rhythm, S1, S2 normal, no murmur, click, rub or gallop Lung:chest clear, no wheezing, rales, normal symmetric air entry Abdomin: soft, non-tender, without masses or organomegaly EXT:no erythema, induration, or nodules   Lab Results: Lab Results  Component Value Date   WBC 11.7* 02/22/2012   HGB 10.9* 02/22/2012   HCT 34.4* 02/22/2012   MCV 87.0 02/22/2012   PLT 288 02/22/2012     Chemistry      Component Value Date/Time   NA 136 08/24/2011 1420   NA 141 02/14/2011 0819   K 3.9 08/24/2011 1420   K 4.2 02/14/2011 0819   CL 101 08/24/2011 1420   CL 99 02/14/2011 0819   CO2 21 08/24/2011 1420   CO2 26 02/14/2011 0819   BUN 16 08/24/2011 1420   BUN 13 02/14/2011 0819   CREATININE 1.08 08/24/2011 1420   CREATININE 1.0 02/14/2011 0819      Component Value Date/Time   CALCIUM 9.0 08/24/2011 1420   CALCIUM 9.0 02/14/2011 0819   ALKPHOS 54 08/24/2011 1420   ALKPHOS 52 02/14/2011 0819   AST 11 08/24/2011 1420   AST 19 02/14/2011 0819   ALT 11 08/24/2011  1420   BILITOT 0.2* 08/24/2011 1420   BILITOT 0.30 02/14/2011 0819      Impression and Plan:  This is a 46 year old female with the following issues: 1. 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  Continue  followup every 6 months and imaging studies as needed 2. Iron deficiency anemia.  I have talked to her about IV iron since she could not tolerate po iron. She elected to defer that for now and will consider that if her counts drop further.   Jessica Hose, MD 10/4/20133:41 PM

## 2012-02-22 NOTE — Telephone Encounter (Signed)
appts made and printed for pt aom °

## 2012-07-05 ENCOUNTER — Other Ambulatory Visit: Payer: Self-pay

## 2012-08-21 ENCOUNTER — Telehealth: Payer: Self-pay | Admitting: Oncology

## 2012-08-22 ENCOUNTER — Other Ambulatory Visit: Payer: BC Managed Care – PPO | Admitting: Lab

## 2012-08-22 ENCOUNTER — Ambulatory Visit: Payer: BC Managed Care – PPO | Admitting: Oncology

## 2012-12-16 ENCOUNTER — Other Ambulatory Visit: Payer: Self-pay

## 2012-12-16 DIAGNOSIS — Z1231 Encounter for screening mammogram for malignant neoplasm of breast: Secondary | ICD-10-CM

## 2013-01-02 ENCOUNTER — Ambulatory Visit
Admission: RE | Admit: 2013-01-02 | Discharge: 2013-01-02 | Disposition: A | Discharge status: Home / Self Care | Payer: No Typology Code available for payment source | Source: Ambulatory Visit

## 2013-01-02 DIAGNOSIS — Z1231 Encounter for screening mammogram for malignant neoplasm of breast: Secondary | ICD-10-CM

## 2013-03-26 ENCOUNTER — Other Ambulatory Visit: Payer: Self-pay

## 2013-11-27 ENCOUNTER — Other Ambulatory Visit: Payer: Self-pay | Admitting: Family Medicine

## 2013-11-27 ENCOUNTER — Other Ambulatory Visit (HOSPITAL_COMMUNITY)
Admission: RE | Admit: 2013-11-27 | Discharge: 2013-11-27 | Disposition: A | Discharge status: Home / Self Care | Payer: No Typology Code available for payment source | Source: Ambulatory Visit | Attending: Family Medicine | Admitting: Family Medicine

## 2013-11-27 DIAGNOSIS — Z01419 Encounter for gynecological examination (general) (routine) without abnormal findings: Secondary | ICD-10-CM | POA: Insufficient documentation

## 2013-12-01 LAB — CYTOLOGY - PAP

## 2015-08-26 ENCOUNTER — Emergency Department (HOSPITAL_COMMUNITY): Payer: 59

## 2015-08-26 ENCOUNTER — Emergency Department (HOSPITAL_COMMUNITY)
Admission: EM | Admit: 2015-08-26 | Discharge: 2015-08-26 | Disposition: A | Discharge status: Home / Self Care | Payer: 59 | Attending: Emergency Medicine | Admitting: Emergency Medicine

## 2015-08-26 ENCOUNTER — Encounter (HOSPITAL_COMMUNITY): Payer: Self-pay | Admitting: Emergency Medicine

## 2015-08-26 DIAGNOSIS — Z79899 Other long term (current) drug therapy: Secondary | ICD-10-CM | POA: Diagnosis not present

## 2015-08-26 DIAGNOSIS — R110 Nausea: Secondary | ICD-10-CM | POA: Diagnosis not present

## 2015-08-26 DIAGNOSIS — R197 Diarrhea, unspecified: Secondary | ICD-10-CM | POA: Diagnosis not present

## 2015-08-26 DIAGNOSIS — Z85528 Personal history of other malignant neoplasm of kidney: Secondary | ICD-10-CM | POA: Diagnosis not present

## 2015-08-26 DIAGNOSIS — E119 Type 2 diabetes mellitus without complications: Secondary | ICD-10-CM | POA: Insufficient documentation

## 2015-08-26 DIAGNOSIS — Z7984 Long term (current) use of oral hypoglycemic drugs: Secondary | ICD-10-CM | POA: Diagnosis not present

## 2015-08-26 DIAGNOSIS — I10 Essential (primary) hypertension: Secondary | ICD-10-CM | POA: Insufficient documentation

## 2015-08-26 DIAGNOSIS — Z3202 Encounter for pregnancy test, result negative: Secondary | ICD-10-CM | POA: Diagnosis not present

## 2015-08-26 DIAGNOSIS — R109 Unspecified abdominal pain: Secondary | ICD-10-CM

## 2015-08-26 DIAGNOSIS — R1032 Left lower quadrant pain: Secondary | ICD-10-CM | POA: Diagnosis not present

## 2015-08-26 DIAGNOSIS — Z87442 Personal history of urinary calculi: Secondary | ICD-10-CM

## 2015-08-26 HISTORY — DX: Malignant (primary) neoplasm, unspecified: C80.1

## 2015-08-26 HISTORY — DX: Essential (primary) hypertension: I10

## 2015-08-26 HISTORY — DX: Type 2 diabetes mellitus without complications: E11.9

## 2015-08-26 LAB — URINALYSIS, ROUTINE W REFLEX MICROSCOPIC
Bilirubin Urine: NEGATIVE
GLUCOSE, UA: NEGATIVE mg/dL
Hgb urine dipstick: NEGATIVE
Ketones, ur: NEGATIVE mg/dL
LEUKOCYTES UA: NEGATIVE
Nitrite: NEGATIVE
PH: 6.5 (ref 5.0–8.0)
Protein, ur: NEGATIVE mg/dL
Specific Gravity, Urine: 1.011 (ref 1.005–1.030)

## 2015-08-26 LAB — COMPREHENSIVE METABOLIC PANEL
ALBUMIN: 2.7 g/dL — AB (ref 3.5–5.0)
ALK PHOS: 77 U/L (ref 38–126)
ALT: 13 U/L — AB (ref 14–54)
ANION GAP: 12 (ref 5–15)
AST: 13 U/L — ABNORMAL LOW (ref 15–41)
BILIRUBIN TOTAL: 0.3 mg/dL (ref 0.3–1.2)
BUN: 18 mg/dL (ref 6–20)
CALCIUM: 8.6 mg/dL — AB (ref 8.9–10.3)
CO2: 27 mmol/L (ref 22–32)
CREATININE: 1.49 mg/dL — AB (ref 0.44–1.00)
Chloride: 98 mmol/L — ABNORMAL LOW (ref 101–111)
GFR calc Af Amer: 47 mL/min — ABNORMAL LOW (ref >60)
GFR calc non Af Amer: 41 mL/min — ABNORMAL LOW (ref >60)
GLUCOSE: 122 mg/dL — AB (ref 65–99)
Potassium: 2.8 mmol/L — ABNORMAL LOW (ref 3.5–5.1)
SODIUM: 137 mmol/L (ref 135–145)
TOTAL PROTEIN: 8.5 g/dL — AB (ref 6.5–8.1)

## 2015-08-26 LAB — CBC
HCT: 27.2 % — ABNORMAL LOW (ref 36.0–46.0)
Hemoglobin: 8.6 g/dL — ABNORMAL LOW (ref 12.0–15.0)
MCH: 24.6 pg — AB (ref 26.0–34.0)
MCHC: 31.6 g/dL (ref 30.0–36.0)
MCV: 77.9 fL — ABNORMAL LOW (ref 78.0–100.0)
Platelets: 508 10*3/uL — ABNORMAL HIGH (ref 150–400)
RBC: 3.49 MIL/uL — ABNORMAL LOW (ref 3.87–5.11)
RDW: 15.9 % — AB (ref 11.5–15.5)
WBC: 15 10*3/uL — ABNORMAL HIGH (ref 4.0–10.5)

## 2015-08-26 LAB — I-STAT BETA HCG BLOOD, ED (MC, WL, AP ONLY): I-stat hCG, quantitative: <5 m[IU]/mL (ref <5)

## 2015-08-26 LAB — POC OCCULT BLOOD, ED: FECAL OCCULT BLD: NEGATIVE

## 2015-08-26 LAB — LIPASE, BLOOD: Lipase: 21 U/L (ref 11–51)

## 2015-08-26 MED ORDER — CIPROFLOXACIN HCL 500 MG PO TABS: 500.0000 mg | ORAL_TABLET | Freq: Two times a day (BID) | ORAL | Status: DC

## 2015-08-26 MED ORDER — SODIUM CHLORIDE 0.9 % IV BOLUS (SEPSIS)
1000.0000 mL | Freq: Once | INTRAVENOUS | Status: AC
  Administered 2015-08-26: 1000 mL via INTRAVENOUS

## 2015-08-26 MED ORDER — FERROUS SULFATE 325 (65 FE) MG PO TABS: 325.0000 mg | ORAL_TABLET | Freq: Every day | ORAL | Status: DC

## 2015-08-26 MED ORDER — OXYCODONE-ACETAMINOPHEN 5-325 MG PO TABS: 1.0000 | ORAL_TABLET | ORAL | Status: DC | PRN

## 2015-08-26 MED ORDER — METRONIDAZOLE 500 MG PO TABS: 500.0000 mg | ORAL_TABLET | Freq: Two times a day (BID) | ORAL | Status: DC

## 2015-08-26 MED ORDER — ONDANSETRON 4 MG PO TBDP: 4.0000 mg | ORAL_TABLET | Freq: Three times a day (TID) | ORAL | Status: DC | PRN

## 2015-08-26 MED ORDER — HYDROMORPHONE HCL 1 MG/ML IJ SOLN
0.5000 mg | Freq: Once | INTRAMUSCULAR | Status: AC
  Administered 2015-08-26: 0.5 mg via INTRAVENOUS
  Filled 2015-08-26: qty 1

## 2015-08-26 MED ORDER — ONDANSETRON 4 MG PO TBDP
4.0000 mg | ORAL_TABLET | Freq: Once | ORAL | Status: AC
  Administered 2015-08-26: 4 mg via ORAL
  Filled 2015-08-26: qty 1

## 2015-08-26 MED ORDER — DICYCLOMINE HCL 10 MG PO CAPS
10.0000 mg | ORAL_CAPSULE | Freq: Once | ORAL | Status: AC
  Administered 2015-08-26: 10 mg via ORAL
  Filled 2015-08-26: qty 1

## 2015-08-26 MED ORDER — POTASSIUM CHLORIDE CRYS ER 20 MEQ PO TBCR
40.0000 meq | EXTENDED_RELEASE_TABLET | Freq: Once | ORAL | Status: AC
  Administered 2015-08-26: 40 meq via ORAL
  Filled 2015-08-26: qty 2

## 2015-08-26 NOTE — Discharge Instructions (Signed)
1. Medications: percocet for pain, zofran for nausea, cipro and flagyl (antibiotics), iron, usual home medications 2. Treatment: rest, drink plenty of fluids 3. Follow Up: please followup with your primary doctor and with GI (call Monday) for discussion of your diagnoses and further evaluation after today's visit; if you do not have a primary care doctor use the phone number listed in your discharge paperwork to find one; please return to the ER for high fever, severe pain, new or worsening symptoms   Abdominal Pain, Adult Many things can cause belly (abdominal) pain. Most times, the belly pain is not dangerous. Many cases of belly pain can be watched and treated at home. HOME CARE   Do not take medicines that help you go poop (laxatives) unless told to by your doctor.  Only take medicine as told by your doctor.  Eat or drink as told by your doctor. Your doctor will tell you if you should be on a special diet. GET HELP IF:  You do not know what is causing your belly pain.  You have belly pain while you are sick to your stomach (nauseous) or have runny poop (diarrhea).  You have pain while you pee or poop.  Your belly pain wakes you up at night.  You have belly pain that gets worse or better when you eat.  You have belly pain that gets worse when you eat fatty foods.  You have a fever. GET HELP RIGHT AWAY IF:   The pain does not go away within 2 hours.  You keep throwing up (vomiting).  The pain changes and is only in the right or left part of the belly.  You have bloody or tarry looking poop. MAKE SURE YOU:   Understand these instructions.  Will watch your condition.  Will get help right away if you are not doing well or get worse.   This information is not intended to replace advice given to you by your health care provider. Make sure you discuss any questions you have with your health care provider.   Document Released: 10/24/2007 Document Revised: 05/28/2014  Document Reviewed: 01/14/2013 Elsevier Interactive Patient Education [employer].

## 2015-08-26 NOTE — ED Notes (Signed)
Pt reports continued generalized abdominal cramping; seen by PCP twice and reports intervention there unsuccessful.

## 2015-08-26 NOTE — ED Provider Notes (Signed)
CSN: [id number]     Arrival date & time 08/26/15  1743 History   First MD Initiated Contact with Patient 08/26/15 1815     Chief Complaint  Patient presents with  . Abdominal Cramping    HPI   Jessica Cox is a 50 y.o. female with a PMH of DM, HTN, kidney cancer who presents to the ED with intermittent abdominal cramping, which she states has been present for 3 weeks. She denies exacerbating factors. She states she has been taking ibuprofen at home with no significant symptom relief. She notes initially, she experienced diarrhea, and her PCP gave her imodium, at which time she did not have a bowel movement for 1 week. She notes her bowel movements "returned" and she saw her PCP this week, and was started on hyoscyamine. She states her last bowel movement was today. She reports nausea, though denies vomiting. She denies fever, chills, dysuria, urgency, frequency, vaginal bleeding, vaginal discharge.   Past Medical History  Diagnosis Date  . Diabetes mellitus without complication ([place])   . Hypertension   . Cancer [facility])     kidney cancer   Past Surgical History  Procedure Laterality Date  . Lung surgery    . Left kidney removal     No family history on file. Social History  Substance Use Topics  . Smoking status: Never Smoker   . Smokeless tobacco: None  . Alcohol Use: Yes     Comment: social   OB History    No data available      Review of Systems  Constitutional: Negative for fever and chills.  Gastrointestinal: Positive for nausea, abdominal pain and diarrhea. Negative for vomiting, constipation and blood in stool.  Genitourinary: Negative for dysuria, urgency and frequency.  All other systems reviewed and are negative.     Allergies  Lisinopril  Home Medications   Prior to Admission medications   Medication Sig Start Date End Date Taking? Authorizing Provider  fluticasone (FLONASE) 50 MCG/ACT nasal spray Place 2 sprays into the nose daily as needed for  allergies.    Yes Historical Provider, MD  hyoscyamine (LEVSIN, ANASPAZ) 0.125 MG tablet Take 0.125 mg by mouth every 6 (six) hours as needed for bladder spasms or cramping.  08/24/15  Yes Historical Provider, MD  loperamide (IMODIUM A-D) 2 MG tablet Take 2 mg by mouth 4 (four) times daily as needed for diarrhea or loose stools.   Yes Historical Provider, MD  pioglitazone (ACTOS) 45 MG tablet Take 45 mg by mouth daily.  07/30/15  Yes Historical Provider, MD  simvastatin (ZOCOR) 40 MG tablet Take 40 mg by mouth at bedtime.   Yes Historical Provider, MD  triamterene-hydrochlorothiazide (MAXZIDE) 75-50 MG tablet Take 1 tablet by mouth daily.  07/30/15  Yes Historical Provider, MD  ciprofloxacin (CIPRO) 500 MG tablet Take 1 tablet (500 mg total) by mouth every 12 (twelve) hours. 08/26/15   Marella Chimes, PA-C  ferrous sulfate 325 (65 FE) MG tablet Take 1 tablet (325 mg total) by mouth daily. 08/26/15   Marella Chimes, PA-C  metroNIDAZOLE (FLAGYL) 500 MG tablet Take 1 tablet (500 mg total) by mouth 2 (two) times daily. 08/26/15   Marella Chimes, PA-C  ondansetron (ZOFRAN ODT) 4 MG disintegrating tablet Take 1 tablet (4 mg total) by mouth every 8 (eight) hours as needed for nausea. 08/26/15   Marella Chimes, PA-C  oxyCODONE-acetaminophen (PERCOCET/ROXICET) 5-325 MG tablet Take 1 tablet by mouth every 4 (four) hours as  needed for severe pain. 08/26/15   Marella Chimes, PA-C    BP 132/74 mmHg  Pulse 103  Temp(Src) 98.9 F (37.2 C) (Oral)  Resp 17  SpO2 96%  LMP 08/16/2015 Physical Exam  Constitutional: She is oriented to person, place, and time. She appears well-developed and well-nourished. No distress.  HENT:  Head: Normocephalic and atraumatic.  Right Ear: External ear normal.  Left Ear: External ear normal.  Nose: Nose normal.  Mouth/Throat: Uvula is midline, oropharynx is clear and moist and mucous membranes are normal.  Eyes: Conjunctivae, EOM and lids are normal. Pupils  are equal, round, and reactive to light. Right eye exhibits no discharge. Left eye exhibits no discharge. No scleral icterus.  Neck: Normal range of motion. Neck supple.  Cardiovascular: Normal rate, regular rhythm, normal heart sounds, intact distal pulses and normal pulses.   Pulmonary/Chest: Effort normal and breath sounds normal. No respiratory distress. She has no wheezes. She has no rales.  Abdominal: Soft. Normal appearance and bowel sounds are normal. She exhibits no distension and no mass. There is no tenderness. There is no rigidity, no rebound and no guarding.  Mild TTP to left flank and left lower quadrant.  Musculoskeletal: Normal range of motion. She exhibits no edema or tenderness.  Neurological: She is alert and oriented to person, place, and time.  Skin: Skin is warm, dry and intact. No rash noted. She is not diaphoretic. No erythema. No pallor.  Psychiatric: She has a normal mood and affect. Her speech is normal and behavior is normal.  Nursing note and vitals reviewed.   ED Course  Procedures (including critical care time)  Labs Review Labs Reviewed  COMPREHENSIVE METABOLIC PANEL - Abnormal; Notable for the following:    Potassium 2.8 (*)    Chloride 98 (*)    Glucose, Bld 122 (*)    Creatinine, Ser 1.49 (*)    Calcium 8.6 (*)    Total Protein 8.5 (*)    Albumin 2.7 (*)    AST 13 (*)    ALT 13 (*)    GFR calc non Af Amer 41 (*)    GFR calc Af Amer 47 (*)    All other components within normal limits  CBC - Abnormal; Notable for the following:    WBC 15.0 (*)    RBC 3.49 (*)    Hemoglobin 8.6 (*)    HCT 27.2 (*)    MCV 77.9 (*)    MCH 24.6 (*)    RDW 15.9 (*)    Platelets 508 (*)    All other components within normal limits  LIPASE, BLOOD  URINALYSIS, ROUTINE W REFLEX MICROSCOPIC (NOT AT [facility])  I-STAT BETA HCG BLOOD, ED ([facility], [facility], [facility] ONLY)  POC OCCULT BLOOD, ED    Imaging Review Ct Abdomen Pelvis Wo Contrast  08/26/2015  CLINICAL DATA:  Generalized  abdominal pain, greater on the left. EXAM: CT ABDOMEN AND PELVIS WITHOUT CONTRAST TECHNIQUE: Multidetector CT imaging of the abdomen and pelvis was performed following the standard protocol without IV contrast. COMPARISON:  02/14/2011 FINDINGS: There is marked mural thickening and pericolic inflammation involving a 12 cm segment of sigmoid colon. There is extensive sigmoid diverticulosis. The length of the involved segment is greater than would be typical for acute diverticulitis, and this could instead represent colitis or ischemia. Neoplasm is not excluded. There is no extraluminal air. There is no bowel obstruction. No drainable abscess. Remainder of the colon is unremarkable. The stomach and small intestine are  unremarkable. There are unremarkable unenhanced appearances of the liver, spleen, pancreas, adrenals. There are unremarkable appearances of the left nephrectomy bed. The right kidney is remarkable only for a 2 mm lower pole collecting system calculus. The abdominal aorta is normal in caliber. There is no atherosclerotic calcification. There is no adenopathy in the abdomen or pelvis. There is no ascites. There is no significant abnormality in the lower chest. There is no significant skeletal lesion. Uterus and ovaries are unremarkable. IMPRESSION: 1. Markedly abnormal 12 cm segment of sigmoid colon. Neoplasm cannot be excluded. This also could represent colitis. Diverticulitis is a consideration, but the length of the involved segment is greater than typical. No extraluminal air. No bowel obstruction. 2. Right nephrolithiasis. 3. Unremarkable appearances of the left nephrectomy bed. Electronically Signed   By: Andreas Newport M.D.   On: 08/26/2015 21:23   I have personally reviewed and evaluated these images and lab results as part of my medical decision-making.   EKG Interpretation None      MDM   Final diagnoses:  Abdominal pain    50 year old female presents with abdominal cramping x 3  weeks. Notes initially she experienced diarrhea, though states this is now resolved. Reports nausea, though denies vomiting. Denies fever, chills, dysuria, urgency, frequency, vaginal bleeding, vaginal discharge.  Patient is afebrile. HR 106. Heart regular rhythm. Lungs clear to auscultation bilaterally. Abdomen soft, non-distended, with mild TTP in left flank and left lower quadrant. No rebound, guarding, or masses.  Patient given bentyl, zofran, and fluids.  CBC remarkable for leukocytosis of 15, hemoglobin 8.6 (patient states she currently has her menstrual cycle and is typically anemic during this time). CMP remarkable for potassium 2.8, patient given oral potassium in the ED, creatinine 1.49.  Lipase within normal limits. Beta hcg negative. UA negative for infection. Hemoccult negative. Discussed findings with patient. Will obtain CT abdomen pelvis.  CT abdomen pelvis remarkable for 12 cm segment of abnormal sigmoid colon, which could represent colitis vs neoplasm, diverticulitis is a consideration, though the length of involved segment is greater than typical. Spoke with patient regarding findings. She is non-toxic and well-appearing, feel she is stable for discharge at this time. Patient discussed with Dr. Stark Jock. Will treat with cipro and flagyl. Will also give short course of pain medication, antiemetic, and iron. Patient to follow-up with GI for further evaluation and management and possible colonoscopy. Strict return precautions discussed. Patient verbalizes her understanding and is in agreement with plan.  BP 132/74 mmHg  Pulse 103  Temp(Src) 98.9 F (37.2 C) (Oral)  Resp 17  SpO2 96%  LMP 08/16/2015    Marella Chimes, PA-C 08/27/15 0032  Veryl Speak, MD 08/29/15 [phone]

## 2016-04-27 ENCOUNTER — Other Ambulatory Visit: Payer: Self-pay | Admitting: Family Medicine

## 2016-04-27 DIAGNOSIS — Z1231 Encounter for screening mammogram for malignant neoplasm of breast: Secondary | ICD-10-CM

## 2016-06-01 ENCOUNTER — Ambulatory Visit: Payer: PRIVATE HEALTH INSURANCE

## 2016-06-22 ENCOUNTER — Ambulatory Visit
Admission: RE | Admit: 2016-06-22 | Discharge: 2016-06-22 | Disposition: A | Discharge status: Home / Self Care | Payer: 59 | Source: Ambulatory Visit | Attending: Family Medicine | Admitting: Family Medicine

## 2016-06-22 DIAGNOSIS — Z1231 Encounter for screening mammogram for malignant neoplasm of breast: Secondary | ICD-10-CM

## 2016-08-31 DIAGNOSIS — I10 Essential (primary) hypertension: Secondary | ICD-10-CM | POA: Diagnosis not present

## 2016-08-31 DIAGNOSIS — E119 Type 2 diabetes mellitus without complications: Secondary | ICD-10-CM | POA: Diagnosis not present

## 2016-08-31 DIAGNOSIS — E782 Mixed hyperlipidemia: Secondary | ICD-10-CM | POA: Diagnosis not present

## 2017-01-23 DIAGNOSIS — B353 Tinea pedis: Secondary | ICD-10-CM | POA: Diagnosis not present

## 2017-01-23 DIAGNOSIS — L03031 Cellulitis of right toe: Secondary | ICD-10-CM | POA: Diagnosis not present

## 2017-01-28 DIAGNOSIS — L309 Dermatitis, unspecified: Secondary | ICD-10-CM | POA: Diagnosis not present

## 2017-02-07 DIAGNOSIS — Z23 Encounter for immunization: Secondary | ICD-10-CM | POA: Diagnosis not present

## 2017-03-15 DIAGNOSIS — I10 Essential (primary) hypertension: Secondary | ICD-10-CM | POA: Diagnosis not present

## 2017-03-15 DIAGNOSIS — E119 Type 2 diabetes mellitus without complications: Secondary | ICD-10-CM | POA: Diagnosis not present

## 2017-03-15 DIAGNOSIS — E782 Mixed hyperlipidemia: Secondary | ICD-10-CM | POA: Diagnosis not present

## 2017-05-21 HISTORY — PX: OTHER SURGICAL HISTORY: SHX169

## 2017-10-04 DIAGNOSIS — E1122 Type 2 diabetes mellitus with diabetic chronic kidney disease: Secondary | ICD-10-CM | POA: Diagnosis not present

## 2017-10-04 DIAGNOSIS — E782 Mixed hyperlipidemia: Secondary | ICD-10-CM | POA: Diagnosis not present

## 2017-10-04 DIAGNOSIS — I10 Essential (primary) hypertension: Secondary | ICD-10-CM | POA: Diagnosis not present

## 2017-10-04 DIAGNOSIS — N182 Chronic kidney disease, stage 2 (mild): Secondary | ICD-10-CM | POA: Diagnosis not present

## 2017-12-25 ENCOUNTER — Other Ambulatory Visit: Payer: Self-pay | Admitting: Family Medicine

## 2017-12-25 DIAGNOSIS — Z1231 Encounter for screening mammogram for malignant neoplasm of breast: Secondary | ICD-10-CM

## 2018-01-20 ENCOUNTER — Encounter (HOSPITAL_COMMUNITY): Payer: Self-pay | Admitting: Emergency Medicine

## 2018-01-20 ENCOUNTER — Emergency Department (HOSPITAL_COMMUNITY): Payer: 59

## 2018-01-20 ENCOUNTER — Emergency Department (HOSPITAL_COMMUNITY)
Admission: EM | Admit: 2018-01-20 | Discharge: 2018-01-20 | Disposition: A | Discharge status: Home / Self Care | Payer: 59 | Attending: Emergency Medicine | Admitting: Emergency Medicine

## 2018-01-20 DIAGNOSIS — Y9389 Activity, other specified: Secondary | ICD-10-CM | POA: Insufficient documentation

## 2018-01-20 DIAGNOSIS — Z79899 Other long term (current) drug therapy: Secondary | ICD-10-CM | POA: Insufficient documentation

## 2018-01-20 DIAGNOSIS — Z7984 Long term (current) use of oral hypoglycemic drugs: Secondary | ICD-10-CM | POA: Insufficient documentation

## 2018-01-20 DIAGNOSIS — E119 Type 2 diabetes mellitus without complications: Secondary | ICD-10-CM | POA: Insufficient documentation

## 2018-01-20 DIAGNOSIS — I959 Hypotension, unspecified: Secondary | ICD-10-CM | POA: Diagnosis not present

## 2018-01-20 DIAGNOSIS — I10 Essential (primary) hypertension: Secondary | ICD-10-CM | POA: Diagnosis not present

## 2018-01-20 DIAGNOSIS — S82831A Other fracture of upper and lower end of right fibula, initial encounter for closed fracture: Secondary | ICD-10-CM | POA: Diagnosis not present

## 2018-01-20 DIAGNOSIS — Y9259 Other trade areas as the place of occurrence of the external cause: Secondary | ICD-10-CM | POA: Insufficient documentation

## 2018-01-20 DIAGNOSIS — Y999 Unspecified external cause status: Secondary | ICD-10-CM | POA: Diagnosis not present

## 2018-01-20 DIAGNOSIS — W19XXXA Unspecified fall, initial encounter: Secondary | ICD-10-CM

## 2018-01-20 DIAGNOSIS — S8251XA Displaced fracture of medial malleolus of right tibia, initial encounter for closed fracture: Secondary | ICD-10-CM | POA: Diagnosis not present

## 2018-01-20 DIAGNOSIS — S82844A Nondisplaced bimalleolar fracture of right lower leg, initial encounter for closed fracture: Secondary | ICD-10-CM | POA: Insufficient documentation

## 2018-01-20 DIAGNOSIS — S82841A Displaced bimalleolar fracture of right lower leg, initial encounter for closed fracture: Secondary | ICD-10-CM | POA: Diagnosis not present

## 2018-01-20 DIAGNOSIS — S99911A Unspecified injury of right ankle, initial encounter: Secondary | ICD-10-CM | POA: Diagnosis present

## 2018-01-20 MED ORDER — HYDROCODONE-ACETAMINOPHEN 5-325 MG PO TABS: 1.0000 | ORAL_TABLET | ORAL | 0 refills | Status: DC | PRN

## 2018-01-20 NOTE — ED Notes (Signed)
Pt removed c collar and refused CT scans

## 2018-01-20 NOTE — ED Notes (Signed)
Splint placed by ortho tech, xray at bedside for repeat films
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
## 2018-01-20 NOTE — Discharge Instructions (Signed)
Do not put any weight on your right leg.  Ice the area, elevated as much as possible.  Call Dr. Doreatha Martin for follow-up.  You will need surgery on your ankle.
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
## 2018-01-22 ENCOUNTER — Ambulatory Visit (INDEPENDENT_AMBULATORY_CARE_PROVIDER_SITE_OTHER): Payer: Self-pay | Admitting: Orthopaedic Surgery

## 2018-01-22 DIAGNOSIS — S9304XA Dislocation of right ankle joint, initial encounter: Secondary | ICD-10-CM | POA: Diagnosis not present

## 2018-01-23 DIAGNOSIS — S9304XA Dislocation of right ankle joint, initial encounter: Secondary | ICD-10-CM | POA: Diagnosis not present

## 2018-01-23 DIAGNOSIS — S86301A Unspecified injury of muscle(s) and tendon(s) of peroneal muscle group at lower leg level, right leg, initial encounter: Secondary | ICD-10-CM | POA: Diagnosis not present

## 2018-01-23 DIAGNOSIS — G8918 Other acute postprocedural pain: Secondary | ICD-10-CM | POA: Diagnosis not present

## 2018-01-23 DIAGNOSIS — S82851A Displaced trimalleolar fracture of right lower leg, initial encounter for closed fracture: Secondary | ICD-10-CM | POA: Diagnosis not present

## 2018-01-23 DIAGNOSIS — S86111A Strain of other muscle(s) and tendon(s) of posterior muscle group at lower leg level, right leg, initial encounter: Secondary | ICD-10-CM | POA: Diagnosis not present

## 2018-01-23 DIAGNOSIS — S93431A Sprain of tibiofibular ligament of right ankle, initial encounter: Secondary | ICD-10-CM | POA: Diagnosis not present

## 2018-01-24 ENCOUNTER — Ambulatory Visit: Payer: 59

## 2018-02-05 DIAGNOSIS — S82851D Displaced trimalleolar fracture of right lower leg, subsequent encounter for closed fracture with routine healing: Secondary | ICD-10-CM | POA: Diagnosis not present

## 2018-03-05 DIAGNOSIS — S82851D Displaced trimalleolar fracture of right lower leg, subsequent encounter for closed fracture with routine healing: Secondary | ICD-10-CM | POA: Diagnosis not present

## 2018-03-06 DIAGNOSIS — M25571 Pain in right ankle and joints of right foot: Secondary | ICD-10-CM | POA: Diagnosis not present

## 2018-03-13 DIAGNOSIS — M25571 Pain in right ankle and joints of right foot: Secondary | ICD-10-CM | POA: Diagnosis not present

## 2018-03-24 DIAGNOSIS — M25571 Pain in right ankle and joints of right foot: Secondary | ICD-10-CM | POA: Diagnosis not present

## 2018-04-04 DIAGNOSIS — S82851D Displaced trimalleolar fracture of right lower leg, subsequent encounter for closed fracture with routine healing: Secondary | ICD-10-CM | POA: Diagnosis not present

## 2018-04-10 DIAGNOSIS — M25571 Pain in right ankle and joints of right foot: Secondary | ICD-10-CM | POA: Diagnosis not present

## 2018-04-11 DIAGNOSIS — I10 Essential (primary) hypertension: Secondary | ICD-10-CM | POA: Diagnosis not present

## 2018-04-11 DIAGNOSIS — E782 Mixed hyperlipidemia: Secondary | ICD-10-CM | POA: Diagnosis not present

## 2018-04-11 DIAGNOSIS — E1122 Type 2 diabetes mellitus with diabetic chronic kidney disease: Secondary | ICD-10-CM | POA: Diagnosis not present

## 2018-04-11 DIAGNOSIS — N182 Chronic kidney disease, stage 2 (mild): Secondary | ICD-10-CM | POA: Diagnosis not present

## 2018-04-24 DIAGNOSIS — M25571 Pain in right ankle and joints of right foot: Secondary | ICD-10-CM | POA: Diagnosis not present

## 2018-05-02 DIAGNOSIS — S82851D Displaced trimalleolar fracture of right lower leg, subsequent encounter for closed fracture with routine healing: Secondary | ICD-10-CM | POA: Diagnosis not present

## 2018-05-08 DIAGNOSIS — M25571 Pain in right ankle and joints of right foot: Secondary | ICD-10-CM | POA: Diagnosis not present

## 2018-05-30 DIAGNOSIS — S82851D Displaced trimalleolar fracture of right lower leg, subsequent encounter for closed fracture with routine healing: Secondary | ICD-10-CM | POA: Diagnosis not present

## 2018-07-11 DIAGNOSIS — S82851D Displaced trimalleolar fracture of right lower leg, subsequent encounter for closed fracture with routine healing: Secondary | ICD-10-CM | POA: Diagnosis not present

## 2018-08-19 DIAGNOSIS — Z01419 Encounter for gynecological examination (general) (routine) without abnormal findings: Secondary | ICD-10-CM | POA: Diagnosis not present

## 2018-08-22 ENCOUNTER — Ambulatory Visit: Payer: 59

## 2018-10-10 ENCOUNTER — Ambulatory Visit: Payer: 59

## 2018-11-28 ENCOUNTER — Other Ambulatory Visit: Payer: Self-pay

## 2018-11-28 ENCOUNTER — Ambulatory Visit
Admission: RE | Admit: 2018-11-28 | Discharge: 2018-11-28 | Disposition: A | Discharge status: Home / Self Care | Payer: 59 | Source: Ambulatory Visit | Attending: Family Medicine | Admitting: Family Medicine

## 2018-11-28 DIAGNOSIS — Z1231 Encounter for screening mammogram for malignant neoplasm of breast: Secondary | ICD-10-CM

## 2020-08-04 DIAGNOSIS — Z20828 Contact with and (suspected) exposure to other viral communicable diseases: Secondary | ICD-10-CM | POA: Diagnosis not present

## 2020-11-01 ENCOUNTER — Other Ambulatory Visit: Payer: Self-pay | Admitting: Family Medicine

## 2020-11-01 DIAGNOSIS — Z1231 Encounter for screening mammogram for malignant neoplasm of breast: Secondary | ICD-10-CM

## 2020-11-05 ENCOUNTER — Ambulatory Visit
Admission: RE | Admit: 2020-11-05 | Discharge: 2020-11-05 | Disposition: A | Discharge status: Home / Self Care | Payer: 59 | Source: Ambulatory Visit | Attending: Family Medicine | Admitting: Family Medicine

## 2020-11-05 DIAGNOSIS — Z1231 Encounter for screening mammogram for malignant neoplasm of breast: Secondary | ICD-10-CM | POA: Diagnosis not present

## 2021-01-27 DIAGNOSIS — H02886 Meibomian gland dysfunction of left eye, unspecified eyelid: Secondary | ICD-10-CM | POA: Diagnosis not present

## 2021-01-27 DIAGNOSIS — H02883 Meibomian gland dysfunction of right eye, unspecified eyelid: Secondary | ICD-10-CM | POA: Diagnosis not present

## 2021-01-27 DIAGNOSIS — H524 Presbyopia: Secondary | ICD-10-CM | POA: Diagnosis not present

## 2021-01-27 DIAGNOSIS — E119 Type 2 diabetes mellitus without complications: Secondary | ICD-10-CM | POA: Diagnosis not present

## 2021-01-27 DIAGNOSIS — H5213 Myopia, bilateral: Secondary | ICD-10-CM | POA: Diagnosis not present

## 2021-01-27 DIAGNOSIS — H04123 Dry eye syndrome of bilateral lacrimal glands: Secondary | ICD-10-CM | POA: Diagnosis not present

## 2021-02-10 DIAGNOSIS — I10 Essential (primary) hypertension: Secondary | ICD-10-CM | POA: Diagnosis not present

## 2021-02-10 DIAGNOSIS — N1831 Chronic kidney disease, stage 3a: Secondary | ICD-10-CM | POA: Diagnosis not present

## 2021-02-10 DIAGNOSIS — Z0000 Encounter for general adult medical examination without abnormal findings: Secondary | ICD-10-CM | POA: Diagnosis not present

## 2021-02-10 DIAGNOSIS — E1122 Type 2 diabetes mellitus with diabetic chronic kidney disease: Secondary | ICD-10-CM | POA: Diagnosis not present

## 2021-02-10 DIAGNOSIS — E782 Mixed hyperlipidemia: Secondary | ICD-10-CM | POA: Diagnosis not present

## 2021-02-10 DIAGNOSIS — Z23 Encounter for immunization: Secondary | ICD-10-CM | POA: Diagnosis not present

## 2021-08-11 DIAGNOSIS — E782 Mixed hyperlipidemia: Secondary | ICD-10-CM | POA: Diagnosis not present

## 2021-08-11 DIAGNOSIS — E1122 Type 2 diabetes mellitus with diabetic chronic kidney disease: Secondary | ICD-10-CM | POA: Diagnosis not present

## 2021-08-11 DIAGNOSIS — I10 Essential (primary) hypertension: Secondary | ICD-10-CM | POA: Diagnosis not present

## 2021-08-11 DIAGNOSIS — N1831 Chronic kidney disease, stage 3a: Secondary | ICD-10-CM | POA: Diagnosis not present

## 2022-02-02 DIAGNOSIS — N1831 Chronic kidney disease, stage 3a: Secondary | ICD-10-CM | POA: Diagnosis not present

## 2022-02-02 DIAGNOSIS — I10 Essential (primary) hypertension: Secondary | ICD-10-CM | POA: Diagnosis not present

## 2022-02-02 DIAGNOSIS — E782 Mixed hyperlipidemia: Secondary | ICD-10-CM | POA: Diagnosis not present

## 2022-02-02 DIAGNOSIS — E1122 Type 2 diabetes mellitus with diabetic chronic kidney disease: Secondary | ICD-10-CM | POA: Diagnosis not present

## 2022-02-02 DIAGNOSIS — Z23 Encounter for immunization: Secondary | ICD-10-CM | POA: Diagnosis not present

## 2022-08-03 ENCOUNTER — Other Ambulatory Visit: Payer: Self-pay | Admitting: Family Medicine

## 2022-08-03 DIAGNOSIS — Z1231 Encounter for screening mammogram for malignant neoplasm of breast: Secondary | ICD-10-CM

## 2022-08-03 DIAGNOSIS — N1831 Chronic kidney disease, stage 3a: Secondary | ICD-10-CM | POA: Diagnosis not present

## 2022-08-03 DIAGNOSIS — E782 Mixed hyperlipidemia: Secondary | ICD-10-CM | POA: Diagnosis not present

## 2022-08-03 DIAGNOSIS — E1122 Type 2 diabetes mellitus with diabetic chronic kidney disease: Secondary | ICD-10-CM | POA: Diagnosis not present

## 2022-08-03 DIAGNOSIS — I10 Essential (primary) hypertension: Secondary | ICD-10-CM | POA: Diagnosis not present

## 2022-08-16 ENCOUNTER — Ambulatory Visit
Admission: RE | Admit: 2022-08-16 | Discharge: 2022-08-16 | Disposition: A | Discharge status: Home / Self Care | Payer: BC Managed Care – PPO | Source: Ambulatory Visit | Attending: Family Medicine | Admitting: Family Medicine

## 2022-08-16 DIAGNOSIS — Z1231 Encounter for screening mammogram for malignant neoplasm of breast: Secondary | ICD-10-CM | POA: Diagnosis not present

## 2022-09-07 DIAGNOSIS — Z124 Encounter for screening for malignant neoplasm of cervix: Secondary | ICD-10-CM | POA: Diagnosis not present

## 2022-09-07 DIAGNOSIS — Z01419 Encounter for gynecological examination (general) (routine) without abnormal findings: Secondary | ICD-10-CM | POA: Diagnosis not present

## 2022-09-07 DIAGNOSIS — N950 Postmenopausal bleeding: Secondary | ICD-10-CM | POA: Diagnosis not present

## 2022-09-07 DIAGNOSIS — Z1151 Encounter for screening for human papillomavirus (HPV): Secondary | ICD-10-CM | POA: Diagnosis not present

## 2022-10-19 DIAGNOSIS — R9389 Abnormal findings on diagnostic imaging of other specified body structures: Secondary | ICD-10-CM | POA: Diagnosis not present

## 2022-10-19 DIAGNOSIS — N950 Postmenopausal bleeding: Secondary | ICD-10-CM | POA: Diagnosis not present

## 2022-10-19 DIAGNOSIS — D259 Leiomyoma of uterus, unspecified: Secondary | ICD-10-CM | POA: Diagnosis not present

## 2022-10-31 DIAGNOSIS — E1165 Type 2 diabetes mellitus with hyperglycemia: Secondary | ICD-10-CM | POA: Diagnosis not present

## 2022-10-31 DIAGNOSIS — R109 Unspecified abdominal pain: Secondary | ICD-10-CM | POA: Diagnosis not present

## 2022-11-09 DIAGNOSIS — N289 Disorder of kidney and ureter, unspecified: Secondary | ICD-10-CM | POA: Diagnosis not present

## 2022-11-23 DIAGNOSIS — N950 Postmenopausal bleeding: Secondary | ICD-10-CM | POA: Diagnosis not present

## 2022-11-23 DIAGNOSIS — R9389 Abnormal findings on diagnostic imaging of other specified body structures: Secondary | ICD-10-CM | POA: Diagnosis not present

## 2022-11-28 DIAGNOSIS — R9389 Abnormal findings on diagnostic imaging of other specified body structures: Secondary | ICD-10-CM | POA: Insufficient documentation

## 2022-11-28 DIAGNOSIS — N950 Postmenopausal bleeding: Secondary | ICD-10-CM | POA: Insufficient documentation

## 2022-11-28 DIAGNOSIS — K5732 Diverticulitis of large intestine without perforation or abscess without bleeding: Secondary | ICD-10-CM | POA: Insufficient documentation

## 2022-11-28 DIAGNOSIS — R1032 Left lower quadrant pain: Secondary | ICD-10-CM | POA: Insufficient documentation

## 2022-11-28 DIAGNOSIS — R933 Abnormal findings on diagnostic imaging of other parts of digestive tract: Secondary | ICD-10-CM | POA: Insufficient documentation

## 2022-11-28 NOTE — H&P (Signed)
Jessica Cox is an 57 y.o. postmenopausal G3P2 who is admitted for Hysteroscopy with Dilation and Curettage using Myosure for PMB and thickened endometrium.  Patient had bleeding in April and May lasting 7 days. LMP was 4 years ago. Reports history of endometrial ablation that did not work well for her.   Work-up: Pap smear (08/2022): NILM/HRHPV negative  TVUS (10/19/22): Uterus: 7.88 x 4.57 x 4.47 cm Endometrial thickening 1.42 cm Fibroid 1 1.11 cm  Fibroid 2 1.82 cm  Fibroid 3 1.40 cm  Fibroid 4 1.87 cm  Fibroid 5 1.68 cm Right Ovary 2.50 cm  Left Ovary 3.24 cm Anteverted uterus. Uterine fibroids- submucosal fibroid 1.1 x 1.0 x 1.0 cm, anterior 1.8 x 1.2 x 2.1 cm, anterior right 1.4 x 1.5 x 1.5 cm, fundal 1.9 x 1.7 x 2.1 cm, posterior 1.1 x 1.4 x 1. cm, decreased in size from 05/09/2016 [place]. Endometrium thickened. Right ovary WNL. Left ovary WNL. No free fluid or adnexal masses seen.   Patient Active Problem List   Diagnosis Date Noted   Diverticulitis of colon 11/28/2022   Imaging of gastrointestinal tract abnormal 11/28/2022   Left lower quadrant pain 11/28/2022   Postmenopausal bleeding 11/28/2022   Thickened endometrium 11/28/2022    MEDICAL/FAMILY/SOCIAL HX: Patient's last menstrual period was 08/16/2015.    Past Medical History:  Diagnosis Date   Cancer [facility])    kidney cancer   Diabetes mellitus without complication ([facility])    Hypertension     Past Surgical History:  Procedure Laterality Date   left kidney removal     LUNG SURGERY      Family History  Problem Relation Age of Onset   Breast cancer Paternal Aunt     Social History:  reports that she has never smoked. She has never used smokeless tobacco. She reports current alcohol use. She reports that she does not use drugs.  ALLERGIES/MEDS:  Allergies:  Allergies  Allergen Reactions   Lisinopril     Low BP.    No medications prior to admission.     Review of Systems  Constitutional:  Negative.   HENT: Negative.    Eyes: Negative.   Respiratory: Negative.    Cardiovascular: Negative.   Gastrointestinal: Negative.   Genitourinary: Negative.   Musculoskeletal: Negative.   Skin: Negative.   Neurological: Negative.   Endo/Heme/Allergies: Negative.   Psychiatric/Behavioral: Negative.      Last menstrual period 08/16/2015. Gen:  NAD, pleasant and cooperative Cardio:  RRR Pulm:  CTAB, no wheezes/rales/rhonchi Abd:  Soft, non-distended, non-tender throughout, no rebound/guarding Ext:  No bilateral LE edema, no bilateral calf tenderness  No results found for this or any previous visit (from the past 24 hour(s)).  No results found.   ASSESSMENT/PLAN: Jessica Cox is a 57 y.o. postmenopausal G3P2 who is admitted for Hysteroscopy with Dilation and Curettage using Myosure.  - Admit to [facility] - Admit labs (CBC, T&S) - Diet:  Per anesthesia/ERAS - IVF:  Per anesthesia - VTE Prophylaxis:  SCDs - Antibiotics: none - D/C home same-day  Consents: I discussed with the patient that this surgery is performed to look inside the uterus and remove the uterine lining.  Prior to surgery, the risks and benefits of the surgery, as well as alternative treatments, have been discussed.  The risks include, but are not limited to bleeding, including the need for a blood transfusion, infection, damage to organs and tissues, including uterine perforation, requiring additional surgery, postoperative pain, short-term and long-term, failure of  the procedure to control symptoms, need for hysterectomy to control bleeding, fluid overload, which could create electrolyte abnormalities and the need to stop the procedure before completion, inability to safely complete the procedure, deep vein thrombosis and/or pulmonary embolism, painful intercourse, complications the course of which cannot be predicted or prevented, and death.  Patient was consented for blood products.  The patient is aware that  bleeding may result in the need for a blood transfusion which includes risk of transmission of HIV (1:2 million), Hepatitis C (1:2 million), and Hepatitis B (1:200 thousand) and transfusion reaction.  Patient voiced understanding of the above risks as well as understanding of indications for blood transfusion.   Steva Ready, DO

## 2022-11-30 ENCOUNTER — Encounter (HOSPITAL_BASED_OUTPATIENT_CLINIC_OR_DEPARTMENT_OTHER): Payer: Self-pay | Admitting: Obstetrics and Gynecology

## 2022-11-30 ENCOUNTER — Other Ambulatory Visit: Payer: Self-pay
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
## 2022-12-04 DIAGNOSIS — H1132 Conjunctival hemorrhage, left eye: Secondary | ICD-10-CM | POA: Diagnosis not present

## 2022-12-12 ENCOUNTER — Ambulatory Visit (HOSPITAL_BASED_OUTPATIENT_CLINIC_OR_DEPARTMENT_OTHER)
Admission: RE | Admit: 2022-12-12 | Discharge: 2022-12-12 | Disposition: A | Discharge status: Home / Self Care | Payer: BC Managed Care – PPO | Attending: Obstetrics and Gynecology | Admitting: Obstetrics and Gynecology

## 2022-12-12 ENCOUNTER — Ambulatory Visit (HOSPITAL_BASED_OUTPATIENT_CLINIC_OR_DEPARTMENT_OTHER): Payer: BC Managed Care – PPO | Admitting: Anesthesiology

## 2022-12-12 ENCOUNTER — Other Ambulatory Visit: Payer: Self-pay

## 2022-12-12 ENCOUNTER — Encounter (HOSPITAL_BASED_OUTPATIENT_CLINIC_OR_DEPARTMENT_OTHER): Payer: Self-pay | Admitting: Obstetrics and Gynecology

## 2022-12-12 ENCOUNTER — Encounter (HOSPITAL_BASED_OUTPATIENT_CLINIC_OR_DEPARTMENT_OTHER)
Admission: RE | Disposition: A | Discharge status: Home / Self Care | Payer: Self-pay | Source: Home / Self Care | Attending: Obstetrics and Gynecology

## 2022-12-12 DIAGNOSIS — Z6841 Body Mass Index (BMI) 40.0 and over, adult: Secondary | ICD-10-CM | POA: Diagnosis not present

## 2022-12-12 DIAGNOSIS — I10 Essential (primary) hypertension: Secondary | ICD-10-CM | POA: Diagnosis not present

## 2022-12-12 DIAGNOSIS — N840 Polyp of corpus uteri: Secondary | ICD-10-CM | POA: Insufficient documentation

## 2022-12-12 DIAGNOSIS — R9389 Abnormal findings on diagnostic imaging of other specified body structures: Secondary | ICD-10-CM | POA: Diagnosis not present

## 2022-12-12 DIAGNOSIS — N950 Postmenopausal bleeding: Secondary | ICD-10-CM | POA: Insufficient documentation

## 2022-12-12 DIAGNOSIS — E119 Type 2 diabetes mellitus without complications: Secondary | ICD-10-CM | POA: Insufficient documentation

## 2022-12-12 DIAGNOSIS — Z01818 Encounter for other preprocedural examination: Secondary | ICD-10-CM

## 2022-12-12 DIAGNOSIS — D250 Submucous leiomyoma of uterus: Secondary | ICD-10-CM | POA: Insufficient documentation

## 2022-12-12 HISTORY — PX: DILATATION & CURETTAGE/HYSTEROSCOPY WITH MYOSURE: SHX6511

## 2022-12-12 LAB — TYPE AND SCREEN
ABO/RH(D): O POS
Antibody Screen: NEGATIVE

## 2022-12-12 LAB — CBC
HCT: 39.3 % (ref 36.0–46.0)
Hemoglobin: 12.2 g/dL (ref 12.0–15.0)
MCH: 28.6 pg (ref 26.0–34.0)
MCHC: 31 g/dL (ref 30.0–36.0)
MCV: 92 fL (ref 80.0–100.0)
Platelets: 257 10*3/uL (ref 150–400)
RBC: 4.27 MIL/uL (ref 3.87–5.11)
RDW: 14 % (ref 11.5–15.5)
WBC: 10.1 10*3/uL (ref 4.0–10.5)
nRBC: 0 % (ref 0.0–0.2)

## 2022-12-12 LAB — POCT I-STAT, CHEM 8
BUN: 28 mg/dL — ABNORMAL HIGH (ref 6–20)
Calcium, Ion: 1.06 mmol/L — ABNORMAL LOW (ref 1.15–1.40)
Chloride: 107 mmol/L (ref 98–111)
Creatinine, Ser: 1.3 mg/dL — ABNORMAL HIGH (ref 0.44–1.00)
Glucose, Bld: 160 mg/dL — ABNORMAL HIGH (ref 70–99)
HCT: 38 % (ref 36.0–46.0)
Hemoglobin: 12.9 g/dL (ref 12.0–15.0)
Potassium: 4.2 mmol/L (ref 3.5–5.1)
Sodium: 135 mmol/L (ref 135–145)
TCO2: 19 mmol/L — ABNORMAL LOW (ref 22–32)

## 2022-12-12 LAB — GLUCOSE, CAPILLARY: Glucose-Capillary: 119 mg/dL — ABNORMAL HIGH (ref 70–99)

## 2022-12-12 SURGERY — DILATATION & CURETTAGE/HYSTEROSCOPY WITH MYOSURE
Anesthesia: General | Site: Uterus

## 2022-12-12 MED ORDER — SILVER NITRATE-POT NITRATE 75-25 % EX MISC
CUTANEOUS | Status: DC | PRN
  Administered 2022-12-12: 2

## 2022-12-12 MED ORDER — DEXAMETHASONE SODIUM PHOSPHATE 10 MG/ML IJ SOLN
INTRAMUSCULAR | Status: AC
  Filled 2022-12-12: qty 1

## 2022-12-12 MED ORDER — SUGAMMADEX SODIUM 200 MG/2ML IV SOLN
INTRAVENOUS | Status: DC | PRN
  Administered 2022-12-12 (×2): 100 mg via INTRAVENOUS

## 2022-12-12 MED ORDER — DEXMEDETOMIDINE HCL IN NACL 80 MCG/20ML IV SOLN
INTRAVENOUS | Status: DC | PRN
  Administered 2022-12-12 (×2): 8 ug via INTRAVENOUS

## 2022-12-12 MED ORDER — ONDANSETRON HCL 4 MG/2ML IJ SOLN
INTRAMUSCULAR | Status: AC
  Filled 2022-12-12: qty 2

## 2022-12-12 MED ORDER — PHENYLEPHRINE 80 MCG/ML (10ML) SYRINGE FOR IV PUSH (FOR BLOOD PRESSURE SUPPORT)
PREFILLED_SYRINGE | INTRAVENOUS | Status: AC
  Filled 2022-12-12: qty 10

## 2022-12-12 MED ORDER — IBUPROFEN 200 MG PO TABS: 400.0000 mg | ORAL_TABLET | Freq: Three times a day (TID) | ORAL | 0 refills | Status: AC | PRN

## 2022-12-12 MED ORDER — LIDOCAINE HCL (PF) 2 % IJ SOLN
INTRAMUSCULAR | Status: AC
  Filled 2022-12-12: qty 5

## 2022-12-12 MED ORDER — PHENYLEPHRINE 80 MCG/ML (10ML) SYRINGE FOR IV PUSH (FOR BLOOD PRESSURE SUPPORT)
PREFILLED_SYRINGE | INTRAVENOUS | Status: DC | PRN
  Administered 2022-12-12: 160 ug via INTRAVENOUS
  Administered 2022-12-12 (×3): 80 ug via INTRAVENOUS
  Administered 2022-12-12: 160 ug via INTRAVENOUS
  Administered 2022-12-12: 80 ug via INTRAVENOUS

## 2022-12-12 MED ORDER — ACETAMINOPHEN 500 MG PO TABS
1000.0000 mg | ORAL_TABLET | Freq: Once | ORAL | Status: AC
  Administered 2022-12-12: 1000 mg via ORAL

## 2022-12-12 MED ORDER — ARTIFICIAL TEARS OPHTHALMIC OINT
TOPICAL_OINTMENT | OPHTHALMIC | Status: AC
  Filled 2022-12-12: qty 3.5

## 2022-12-12 MED ORDER — EPHEDRINE SULFATE (PRESSORS) 50 MG/ML IJ SOLN
INTRAMUSCULAR | Status: DC | PRN
  Administered 2022-12-12: 5 mg via INTRAVENOUS

## 2022-12-12 MED ORDER — DEXMEDETOMIDINE HCL IN NACL 80 MCG/20ML IV SOLN
INTRAVENOUS | Status: AC
  Filled 2022-12-12: qty 20

## 2022-12-12 MED ORDER — ROCURONIUM 10MG/ML (10ML) SYRINGE FOR MEDFUSION PUMP - OPTIME
INTRAVENOUS | Status: DC | PRN
  Administered 2022-12-12: 30 mg via INTRAVENOUS

## 2022-12-12 MED ORDER — SUCCINYLCHOLINE CHLORIDE 200 MG/10ML IV SOSY
PREFILLED_SYRINGE | INTRAVENOUS | Status: DC | PRN
  Administered 2022-12-12: 140 mg via INTRAVENOUS

## 2022-12-12 MED ORDER — MIDAZOLAM HCL 2 MG/2ML IJ SOLN
INTRAMUSCULAR | Status: AC
  Filled 2022-12-12: qty 2

## 2022-12-12 MED ORDER — FENTANYL CITRATE (PF) 100 MCG/2ML IJ SOLN
INTRAMUSCULAR | Status: DC | PRN
  Administered 2022-12-12: 50 ug via INTRAVENOUS

## 2022-12-12 MED ORDER — AMISULPRIDE (ANTIEMETIC) 5 MG/2ML IV SOLN: 10.0000 mg | Freq: Once | INTRAVENOUS | Status: DC | PRN

## 2022-12-12 MED ORDER — FENTANYL CITRATE (PF) 100 MCG/2ML IJ SOLN
INTRAMUSCULAR | Status: AC
  Filled 2022-12-12: qty 2

## 2022-12-12 MED ORDER — GLYCOPYRROLATE PF 0.2 MG/ML IJ SOSY
PREFILLED_SYRINGE | INTRAMUSCULAR | Status: DC | PRN
  Administered 2022-12-12: .2 mg via INTRAVENOUS

## 2022-12-12 MED ORDER — PROPOFOL 10 MG/ML IV BOLUS
INTRAVENOUS | Status: AC
  Filled 2022-12-12: qty 20

## 2022-12-12 MED ORDER — MIDAZOLAM HCL 5 MG/5ML IJ SOLN
INTRAMUSCULAR | Status: DC | PRN
  Administered 2022-12-12: 2 mg via INTRAVENOUS

## 2022-12-12 MED ORDER — SODIUM CHLORIDE 0.9 % IV SOLN: INTRAVENOUS | Status: DC

## 2022-12-12 MED ORDER — EPHEDRINE 5 MG/ML INJ
INTRAVENOUS | Status: AC
  Filled 2022-12-12: qty 5

## 2022-12-12 MED ORDER — SODIUM CHLORIDE 0.9 % IR SOLN
Status: DC | PRN
  Administered 2022-12-12: 3000 mL

## 2022-12-12 MED ORDER — PROPOFOL 10 MG/ML IV BOLUS
INTRAVENOUS | Status: DC | PRN
Start: 2022-12-12 — End: 2022-12-12
  Administered 2022-12-12: 200 mg via INTRAVENOUS
  Administered 2022-12-12: 100 mg via INTRAVENOUS

## 2022-12-12 MED ORDER — ACETAMINOPHEN 500 MG PO TABS
ORAL_TABLET | ORAL | Status: AC
  Filled 2022-12-12: qty 2

## 2022-12-12 MED ORDER — DEXAMETHASONE SODIUM PHOSPHATE 10 MG/ML IJ SOLN
INTRAMUSCULAR | Status: DC | PRN
  Administered 2022-12-12: 5 mg via INTRAVENOUS

## 2022-12-12 MED ORDER — LIDOCAINE 2% (20 MG/ML) 5 ML SYRINGE
INTRAMUSCULAR | Status: DC | PRN
  Administered 2022-12-12: 60 mg via INTRAVENOUS

## 2022-12-12 MED ORDER — SUCCINYLCHOLINE CHLORIDE 200 MG/10ML IV SOSY
PREFILLED_SYRINGE | INTRAVENOUS | Status: AC
  Filled 2022-12-12: qty 10

## 2022-12-12 MED ORDER — ONDANSETRON HCL 4 MG/2ML IJ SOLN
INTRAMUSCULAR | Status: DC | PRN
Start: 2022-12-12 — End: 2022-12-12
  Administered 2022-12-12: 4 mg via INTRAVENOUS

## 2022-12-12 MED ORDER — FENTANYL CITRATE (PF) 100 MCG/2ML IJ SOLN
25.0000 ug | INTRAMUSCULAR | Status: DC | PRN
  Administered 2022-12-12: 50 ug via INTRAVENOUS

## 2022-12-12 MED ORDER — GLYCOPYRROLATE PF 0.2 MG/ML IJ SOSY
PREFILLED_SYRINGE | INTRAMUSCULAR | Status: AC
  Filled 2022-12-12: qty 1

## 2022-12-12 SURGICAL SUPPLY — 19 items
CATH ROBINSON RED A/P 16FR (CATHETERS) ×1 IMPLANT
DEVICE MYOSURE LITE (MISCELLANEOUS) IMPLANT
DEVICE MYOSURE REACH (MISCELLANEOUS) IMPLANT
DILATOR CANAL MILEX (MISCELLANEOUS) IMPLANT
DRSG TELFA 3X8 NADH STRL (GAUZE/BANDAGES/DRESSINGS) ×1 IMPLANT
GAUZE 4X4 16PLY ~~LOC~~+RFID DBL (SPONGE) ×1 IMPLANT
GLOVE BIO SURGEON STRL SZ 6.5 (GLOVE) ×1 IMPLANT
GLOVE BIOGEL M 6.5 STRL (GLOVE) ×1 IMPLANT
GLOVE BIOGEL PI IND STRL 7.0 (GLOVE) ×1 IMPLANT
GOWN STRL REUS W/TWL LRG LVL3 (GOWN DISPOSABLE) ×1 IMPLANT
IV NS IRRIG 3000ML ARTHROMATIC (IV SOLUTION) IMPLANT
KIT PROCEDURE FLUENT (KITS) ×1 IMPLANT
KIT TURNOVER CYSTO (KITS) ×1 IMPLANT
PACK VAGINAL MINOR WOMEN LF (CUSTOM PROCEDURE TRAY) ×1 IMPLANT
PAD OB MATERNITY 4.3X12.25 (PERSONAL CARE ITEMS) ×1 IMPLANT
SEAL ROD LENS SCOPE MYOSURE (ABLATOR) ×1 IMPLANT
SLEEVE SCD COMPRESS KNEE MED (STOCKING) ×1 IMPLANT
TOWEL OR 17X24 6PK STRL BLUE (TOWEL DISPOSABLE) ×2 IMPLANT
UNDERPAD 30X36 HEAVY ABSORB (UNDERPADS AND DIAPERS) ×1 IMPLANT

## 2022-12-12 NOTE — Transfer of Care (Signed)
Immediate Anesthesia Transfer of Care Note  Patient: Jessica Cox  Procedure(s) Performed: DILATATION & CURETTAGE/HYSTEROSCOPY WITH MYOSURE (Uterus)  Patient Location: PACU  Anesthesia Type:General  Level of Consciousness: awake, alert , oriented, and patient cooperative  Airway & Oxygen Therapy: Patient Spontanous Breathing and Patient connected to face mask oxygen  Post-op Assessment: Report given to RN and Post -op Vital signs reviewed and stable  Post vital signs: Reviewed and stable  Last Vitals:  Vitals Value Taken Time  BP 100/61 12/12/22 0830  Temp 36.4 C 12/12/22 0826  Pulse 92 12/12/22 0830  Resp 18 12/12/22 0833  SpO2 88 % 12/12/22 0830  Vitals shown include unfiled device data.  Last Pain:  Vitals:   12/12/22 0826  TempSrc:   PainSc: 7       Patients Stated Pain Goal: 3 (12/12/22 0826)  Complications: No notable events documented.

## 2022-12-12 NOTE — Anesthesia Preprocedure Evaluation (Addendum)
Anesthesia Evaluation  Patient identified by MRN, date of birth, ID band Patient awake    Reviewed: Allergy & Precautions, NPO status , Patient's Chart, lab work & pertinent test results  Airway Mallampati: III  TM Distance: >3 FB Neck ROM: Full    Dental  (+) Dental Advisory Given   Pulmonary neg pulmonary ROS   breath sounds clear to auscultation       Cardiovascular hypertension, Pt. on medications  Rhythm:Regular Rate:Normal     Neuro/Psych negative neurological ROS     GI/Hepatic negative GI ROS, Neg liver ROS,,,  Endo/Other  diabetes, Type 2  Morbid obesity  Renal/GU S/p left nephrectomy     Musculoskeletal   Abdominal   Peds  Hematology negative hematology ROS (+)   Anesthesia Other Findings   Reproductive/Obstetrics                             Anesthesia Physical Anesthesia Plan  ASA: 3  Anesthesia Plan: General   Post-op Pain Management: Tylenol PO (pre-op)*   Induction: Intravenous  PONV Risk Score and Plan: 3 and Dexamethasone, Midazolam, Ondansetron and Treatment may vary due to age or medical condition  Airway Management Planned: Oral ETT  Additional Equipment:   Intra-op Plan:   Post-operative Plan: Extubation in OR  Informed Consent: I have reviewed the patients History and Physical, chart, labs and discussed the procedure including the risks, benefits and alternatives for the proposed anesthesia with the patient or authorized representative who has indicated his/her understanding and acceptance.     Dental advisory given  Plan Discussed with: CRNA  Anesthesia Plan Comments:        Anesthesia Quick Evaluation

## 2022-12-12 NOTE — Interval H&P Note (Signed)
History and Physical Interval Note:  12/12/2022 7:23 AM  Jessica Cox  has presented today for surgery, with the diagnosis of Postmenopausal Bleeding and thickened endometrium.  The various methods of treatment have been discussed with the patient and family. After consideration of risks, benefits and other options for treatment, the patient has consented to  Procedure(s): DILATATION & CURETTAGE/HYSTEROSCOPY WITH MYOSURE (N/A) as a surgical intervention.  The patient's history has been reviewed, patient examined, no change in status, stable for surgery.  I have reviewed the patient's chart and labs.  Questions were answered to the patient's satisfaction.     Steva Ready

## 2022-12-12 NOTE — Op Note (Signed)
Pre Op Dx:   1. Postmenopausal bleeding 2. Thickened endometrium on ultrasound (1.42cm) 3. Uterine fibroids  Post Op Dx:   1. Postmenopausal bleeding 2. Thickened endometrium on ultrasound (1.42cm) 3. Uterine fibroids 4. Endometrial polyps  Procedure:   Hysteroscopy with Dilation and Curettage using Myosure   Surgeon:  Dr. Steva Ready Assistants:  None Anesthesia:  General   EBL:  5cc  IVF: See anesthesia documentation UOP:  Voided prior to arrival to OR Fluid Deficit:  100cc   Drains:  None Specimen removed:  Endometrial curettings, endometrial polyps, and portion of submucosal fibroid - sent to pathology Device(s) implanted: None Case Type:  Clean-contaminated Findings:  Normal-appearing cervix and endocervical canal. Endometrium mildly proliferative and endometrial broad-based endometrial polyps noted anteriorly and posteriorly. Anterior ~2cm submucosal fibroid present. Bilateral tubal ostia visualized. Complications: None Indications:  57 y.o. postmenopausal G3P2 with postmenopausal bleeding and thickened endometrium who declined outpatient EMB.  Description of each procedure:  After informed consent was obtained the patient was taken to the operating room in the dorsal supine position.  After administration of general anesthesia, the patient was placed in the dorsal lithotomy position and prepped and draped in the usual sterile fashion. A pre-operative time-out was completed.  The anterior lip of the cervix was grasped with a single-tooth tenaculum and the cervix was serially dilated to accommodate the hysteroscope.  The hysteroscope was advanced and the findings as above was noted. The Myosure Reach was used to sample the endometrium throughout, resect the endometrial polyps, and resect a portion of the submucosal fibroid. The single tooth tenaculum was removed and its sites were made hemostatic with silver nitrate and pressure.  Adequate hemostasis was noted.  The patient was  awakened and extubated and appeared to have tolerated the procedure well.  All counts were correct.  Disposition:  PACU  Steva Ready, DO

## 2022-12-12 NOTE — Anesthesia Postprocedure Evaluation (Signed)
Anesthesia Post Note  Patient: Tashema Tiller Loyer  Procedure(s) Performed: DILATATION & CURETTAGE/HYSTEROSCOPY WITH MYOSURE (Uterus)     Patient location during evaluation: PACU Anesthesia Type: General Level of consciousness: awake and alert Pain management: pain level controlled Vital Signs Assessment: post-procedure vital signs reviewed and stable Respiratory status: spontaneous breathing, nonlabored ventilation, respiratory function stable and patient connected to nasal cannula oxygen Cardiovascular status: blood pressure returned to baseline and stable Postop Assessment: no apparent nausea or vomiting Anesthetic complications: no  No notable events documented.  Last Vitals:  Vitals:   12/12/22 0900 12/12/22 0930  BP: 90/64 104/70  Pulse: 87 88  Resp: 16 16  Temp: (!) 36.2 C   SpO2: 94% 94%    Last Pain:  Vitals:   12/12/22 0930  TempSrc:   PainSc: 3                  Kennieth Rad

## 2022-12-12 NOTE — Discharge Instructions (Signed)
      Post Anesthesia Home Care Instructions  Activity: Get plenty of rest for the remainder of the day. A responsible individual must stay with you for 24 hours following the procedure.  For the next 24 hours, DO NOT: -Drive a car -Advertising copywriter -Drink alcoholic beverages -Take any medication unless instructed by your physician -Make any legal decisions or sign important papers.  Meals: Start with liquid foods such as gelatin or soup. Progress to regular foods as tolerated. Avoid greasy, spicy, heavy foods. If nausea and/or vomiting occur, drink only clear liquids until the nausea and/or vomiting subsides. Call your physician if vomiting continues.  Special Instructions/Symptoms: Your throat may feel dry or sore from the anesthesia or the breathing tube placed in your throat during surgery. If this causes discomfort, gargle with warm salt water. The discomfort should disappear within 24 hours.   Do not take any Tylenol until after 12:15 pm today, if needed.

## 2022-12-12 NOTE — Anesthesia Procedure Notes (Signed)
Procedure Name: Intubation Date/Time: 12/12/2022 7:47 AM  Performed by: Bishop Limbo, CRNAPre-anesthesia Checklist: Patient identified, Emergency Drugs available, Suction available and Patient being monitored Patient Re-evaluated:Patient Re-evaluated prior to induction Oxygen Delivery Method: Circle System Utilized Preoxygenation: Pre-oxygenation with 100% oxygen Induction Type: IV induction Ventilation: Mask ventilation without difficulty Laryngoscope Size: Mac and 3 Grade View: Grade I Tube type: Oral Tube size: 7.0 mm Number of attempts: 1 Airway Equipment and Method: Stylet and Oral airway Placement Confirmation: ETT inserted through vocal cords under direct vision, positive ETCO2 and breath sounds checked- equal and bilateral Secured at: 23 cm Tube secured with: Tape Dental Injury: Teeth and Oropharynx as per pre-operative assessment

## 2022-12-13 ENCOUNTER — Encounter (HOSPITAL_BASED_OUTPATIENT_CLINIC_OR_DEPARTMENT_OTHER): Payer: Self-pay | Admitting: Obstetrics and Gynecology

## 2022-12-27 DIAGNOSIS — N950 Postmenopausal bleeding: Secondary | ICD-10-CM | POA: Diagnosis not present

## 2022-12-27 DIAGNOSIS — D259 Leiomyoma of uterus, unspecified: Secondary | ICD-10-CM | POA: Diagnosis not present

## 2022-12-27 DIAGNOSIS — N840 Polyp of corpus uteri: Secondary | ICD-10-CM | POA: Diagnosis not present

## 2023-02-08 DIAGNOSIS — E1122 Type 2 diabetes mellitus with diabetic chronic kidney disease: Secondary | ICD-10-CM | POA: Diagnosis not present

## 2023-02-08 DIAGNOSIS — I10 Essential (primary) hypertension: Secondary | ICD-10-CM | POA: Diagnosis not present

## 2023-02-08 DIAGNOSIS — E782 Mixed hyperlipidemia: Secondary | ICD-10-CM | POA: Diagnosis not present

## 2023-02-08 DIAGNOSIS — Z23 Encounter for immunization: Secondary | ICD-10-CM | POA: Diagnosis not present

## 2023-02-08 DIAGNOSIS — N1831 Chronic kidney disease, stage 3a: Secondary | ICD-10-CM | POA: Diagnosis not present

## 2023-02-13 HISTORY — DX: Personal history of urinary calculi: Z87.442

## 2023-02-13 HISTORY — DX: Presence of spectacles and contact lenses: Z97.3

## 2023-02-13 HISTORY — DX: Postmenopausal bleeding: N95.0

## 2023-05-10 DIAGNOSIS — E1122 Type 2 diabetes mellitus with diabetic chronic kidney disease: Secondary | ICD-10-CM | POA: Diagnosis not present

## 2023-05-30 IMAGING — MG DIGITAL SCREENING BILAT W/ CAD
8 series · 8 of 8 positions shown · non-contrast
Comparison: Previous exam(s).

CLINICAL DATA: Screening.

EXAM:
DIGITAL SCREENING BILATERAL MAMMOGRAM WITH CAD
TECHNIQUE: Bilateral screening digital craniocaudal and mediolateral oblique
mammograms were obtained. The images were evaluated with
computer-aided detection.

[R MLO (1 of 2)]
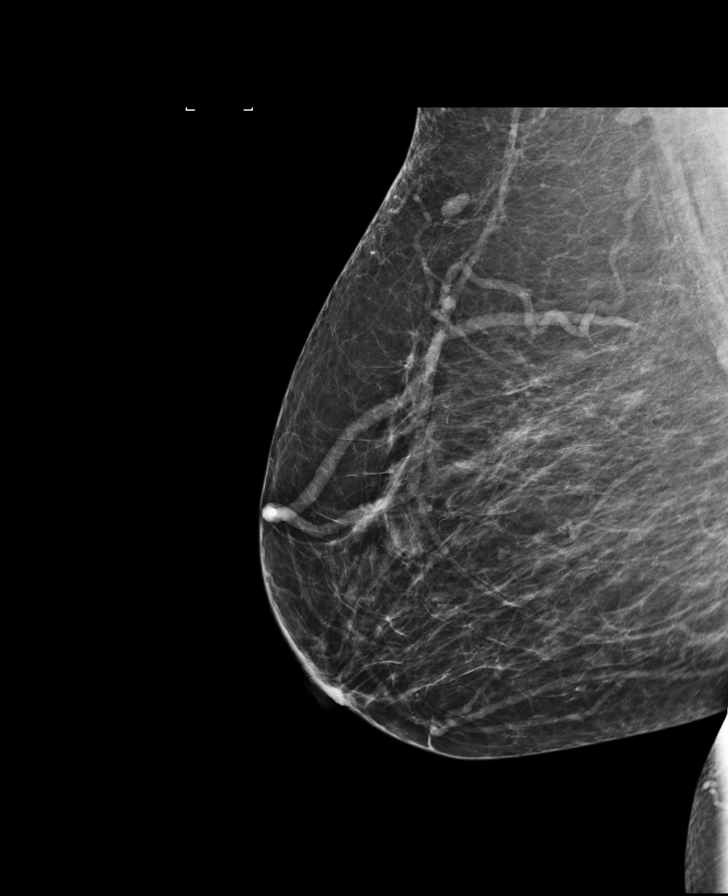

[L MLO (1 of 2)]
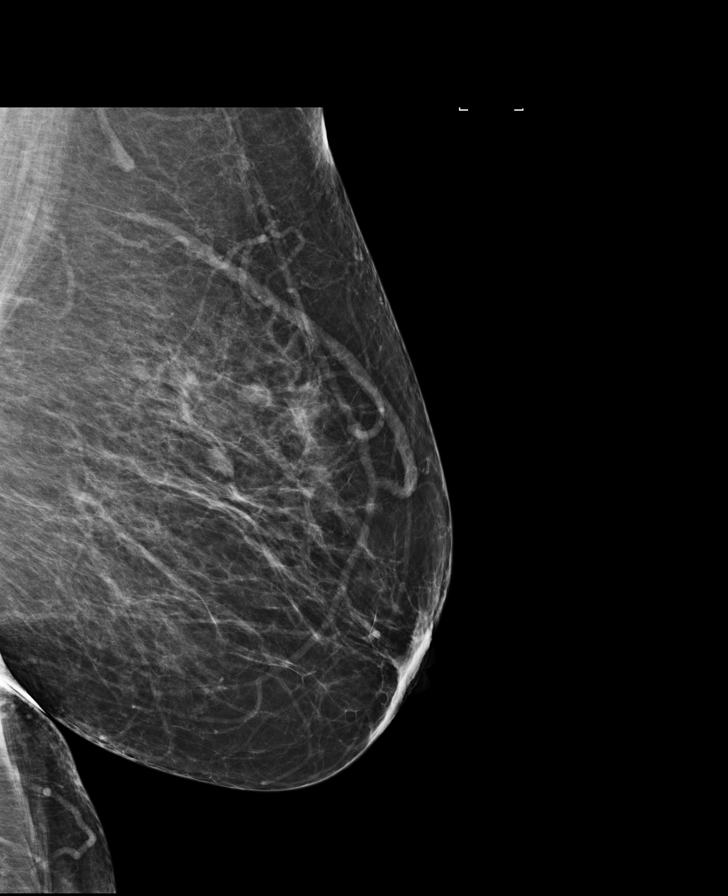

[R CC (1 of 2)]
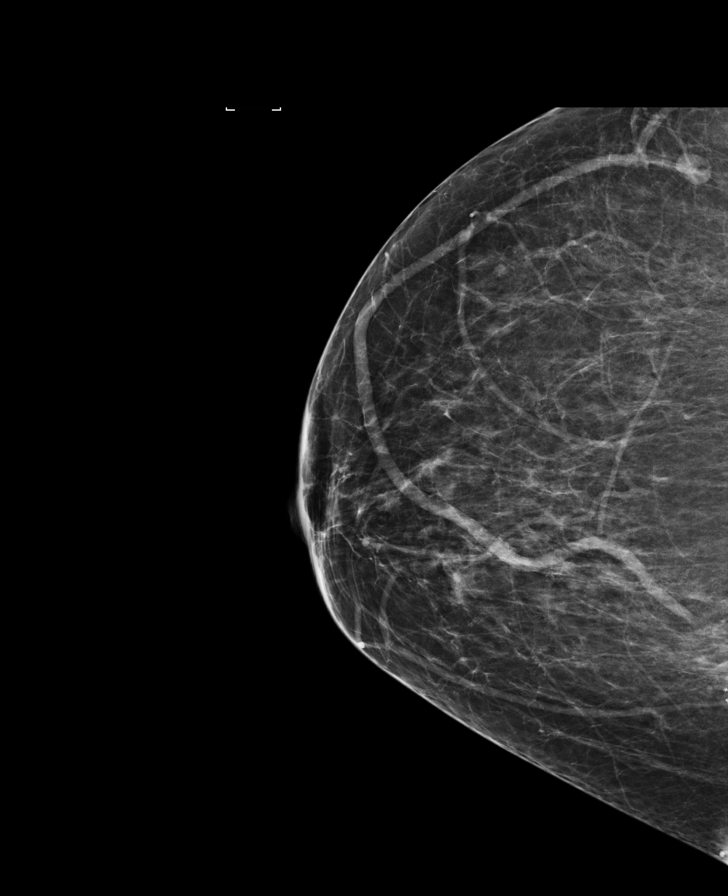

[L CC (1 of 2)]
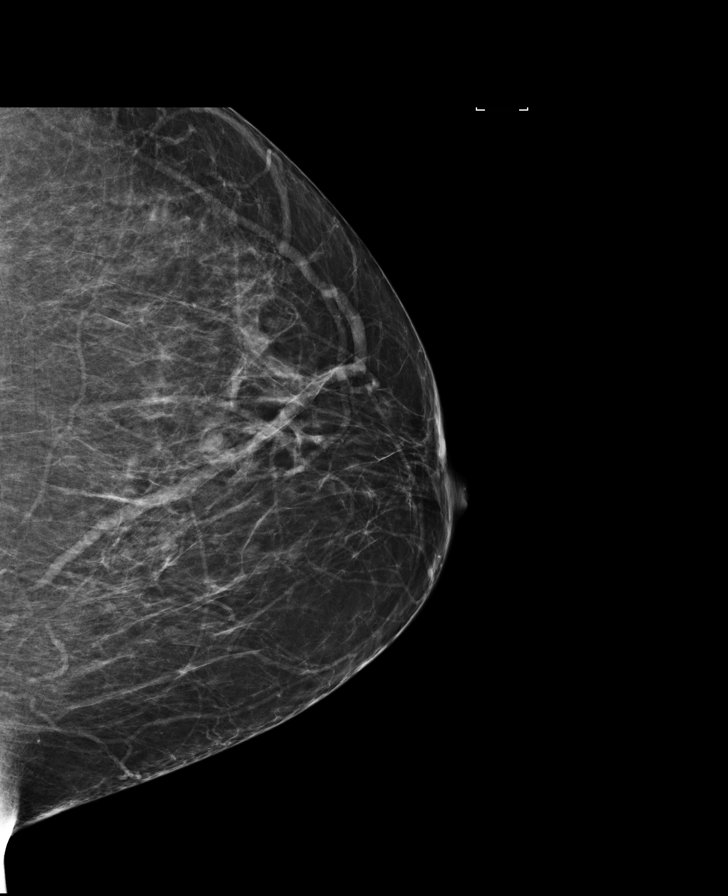

[L CC (2 of 2)]
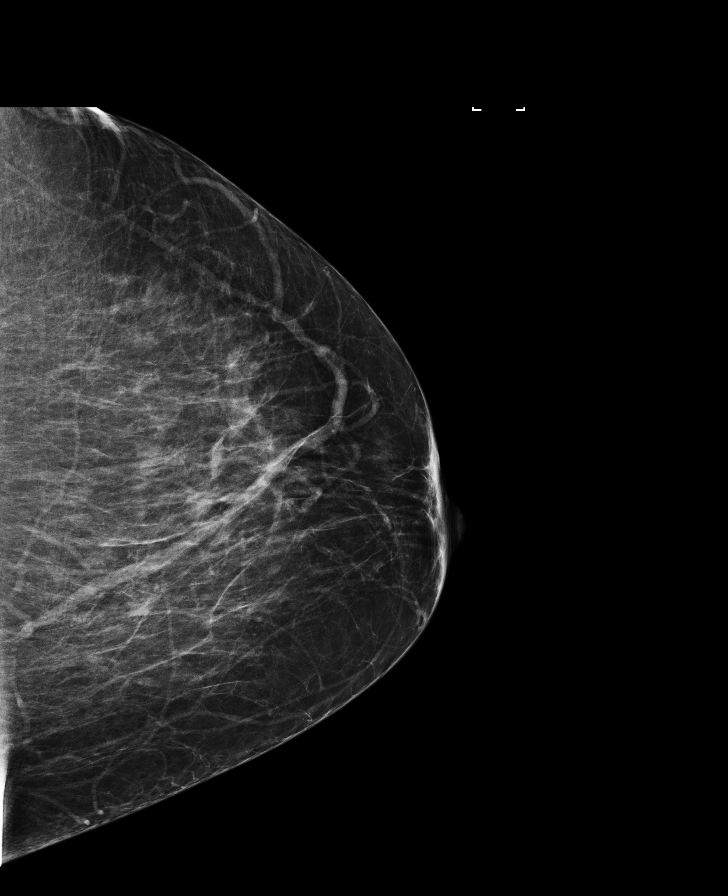

[L MLO (2 of 2)]
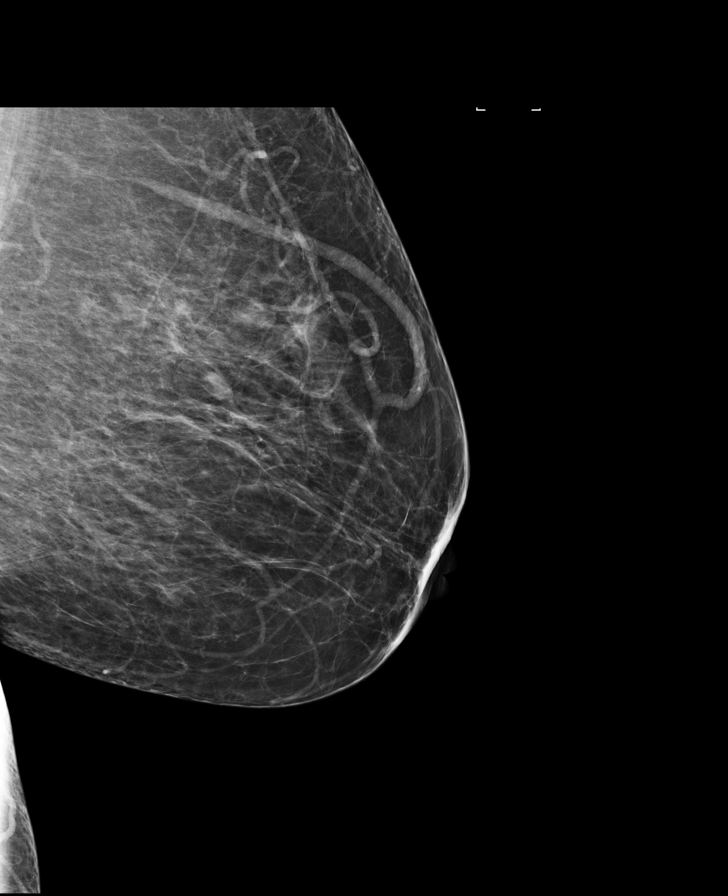

[R CC (2 of 2)]
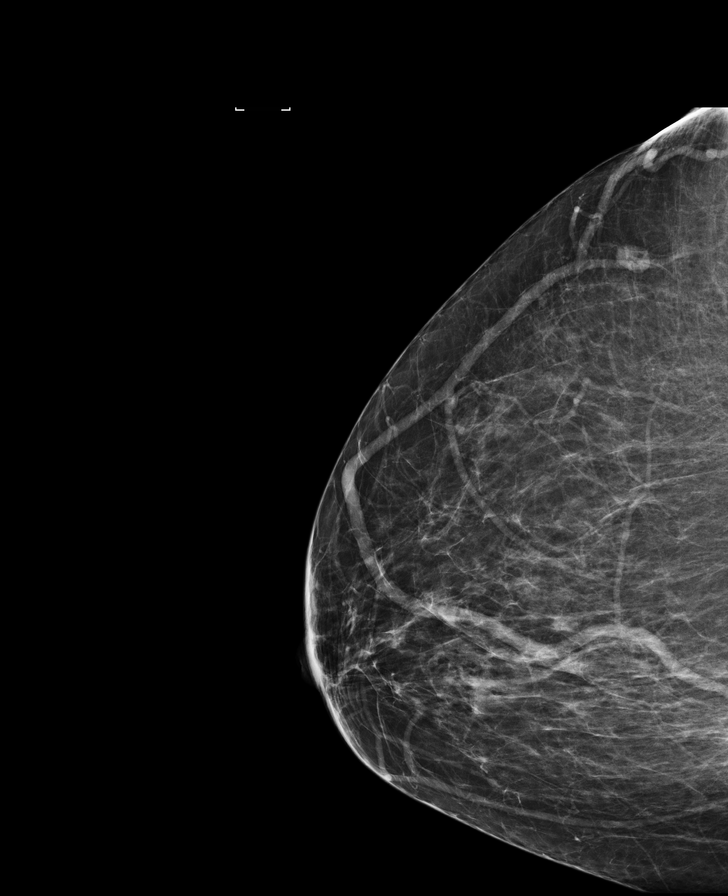

[R MLO (2 of 2)]
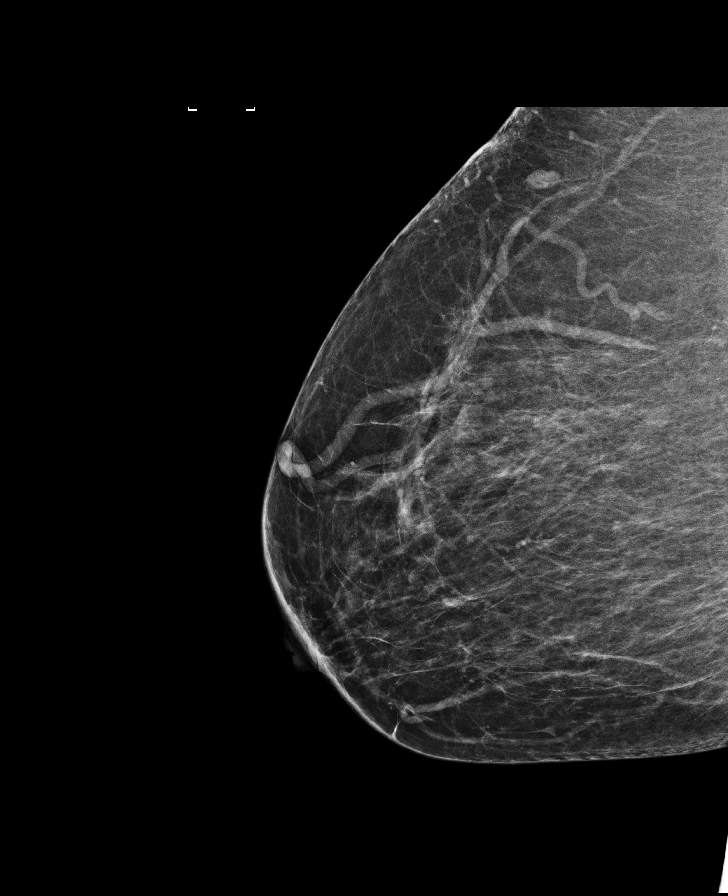

[8 of 8 positions shown; findings below may reference images not displayed]

ACR Breast Density Category b: There are scattered areas of
fibroglandular density.
FINDINGS: There are no findings suspicious for malignancy.
IMPRESSION: No mammographic evidence of malignancy. A result letter of this
screening mammogram will be mailed directly to the patient.

RECOMMENDATION:
Screening mammogram in one year. (Code:WO-V-ZRK)

BI-RADS CATEGORY  1: Negative.

## 2023-08-09 DIAGNOSIS — E1122 Type 2 diabetes mellitus with diabetic chronic kidney disease: Secondary | ICD-10-CM | POA: Diagnosis not present

## 2023-08-09 DIAGNOSIS — N1831 Chronic kidney disease, stage 3a: Secondary | ICD-10-CM | POA: Diagnosis not present

## 2023-08-09 DIAGNOSIS — E782 Mixed hyperlipidemia: Secondary | ICD-10-CM | POA: Diagnosis not present

## 2023-08-09 DIAGNOSIS — I10 Essential (primary) hypertension: Secondary | ICD-10-CM | POA: Diagnosis not present

## 2023-08-23 DIAGNOSIS — Z01419 Encounter for gynecological examination (general) (routine) without abnormal findings: Secondary | ICD-10-CM | POA: Diagnosis not present

## 2023-10-09 ENCOUNTER — Other Ambulatory Visit: Payer: Self-pay | Admitting: Family Medicine

## 2023-10-09 DIAGNOSIS — Z1231 Encounter for screening mammogram for malignant neoplasm of breast: Secondary | ICD-10-CM

## 2023-10-18 ENCOUNTER — Ambulatory Visit
Admission: RE | Admit: 2023-10-18 | Discharge: 2023-10-18 | Disposition: A | Discharge status: Home / Self Care | Source: Ambulatory Visit | Attending: Family Medicine | Admitting: Family Medicine

## 2023-10-18 DIAGNOSIS — Z1231 Encounter for screening mammogram for malignant neoplasm of breast: Secondary | ICD-10-CM

## 2024-02-14 DIAGNOSIS — E782 Mixed hyperlipidemia: Secondary | ICD-10-CM | POA: Diagnosis not present

## 2024-02-14 DIAGNOSIS — N1831 Chronic kidney disease, stage 3a: Secondary | ICD-10-CM | POA: Diagnosis not present

## 2024-02-14 DIAGNOSIS — E1122 Type 2 diabetes mellitus with diabetic chronic kidney disease: Secondary | ICD-10-CM | POA: Diagnosis not present

## 2024-02-14 DIAGNOSIS — I10 Essential (primary) hypertension: Secondary | ICD-10-CM | POA: Diagnosis not present
